# Patient Record
Sex: Male | Born: 1990 | Race: Black or African American | Hispanic: No | Marital: Single | Smoking: Current every day smoker
Health system: Southern US, Community
[De-identification: ages and names within clinical notes are randomized; demographics above are authoritative.]

## PROBLEM LIST (undated history)

## (undated) DIAGNOSIS — F1910 Other psychoactive substance abuse, uncomplicated: Secondary | ICD-10-CM

## (undated) DIAGNOSIS — J45909 Unspecified asthma, uncomplicated: Secondary | ICD-10-CM

---

## 2014-07-22 ENCOUNTER — Encounter (HOSPITAL_COMMUNITY): Payer: Self-pay | Admitting: Emergency Medicine

## 2014-07-22 ENCOUNTER — Emergency Department (HOSPITAL_COMMUNITY)
Admission: EM | Admit: 2014-07-22 | Discharge: 2014-07-22 | Disposition: A | Discharge status: Home / Self Care | Payer: BC Managed Care – PPO | Attending: Emergency Medicine | Admitting: Emergency Medicine

## 2014-07-22 DIAGNOSIS — IMO0002 Reserved for concepts with insufficient information to code with codable children: Secondary | ICD-10-CM | POA: Insufficient documentation

## 2014-07-22 DIAGNOSIS — J45901 Unspecified asthma with (acute) exacerbation: Secondary | ICD-10-CM | POA: Insufficient documentation

## 2014-07-22 DIAGNOSIS — F17200 Nicotine dependence, unspecified, uncomplicated: Secondary | ICD-10-CM | POA: Insufficient documentation

## 2014-07-22 HISTORY — DX: Unspecified asthma, uncomplicated: J45.909

## 2014-07-22 MED ORDER — PREDNISONE 20 MG PO TABS
60.0000 mg | ORAL_TABLET | Freq: Once | ORAL | Status: AC
  Administered 2014-07-22: 60 mg via ORAL
  Filled 2014-07-22: qty 3

## 2014-07-22 MED ORDER — ALBUTEROL SULFATE HFA 108 (90 BASE) MCG/ACT IN AERS
4.0000 | INHALATION_SPRAY | Freq: Once | RESPIRATORY_TRACT | Status: AC
  Administered 2014-07-22: 4 via RESPIRATORY_TRACT
  Filled 2014-07-22: qty 6.7

## 2014-07-22 MED ORDER — PREDNISONE 20 MG PO TABS: ORAL_TABLET | ORAL | Status: DC

## 2014-07-22 MED ORDER — IPRATROPIUM-ALBUTEROL 0.5-2.5 (3) MG/3ML IN SOLN
3.0000 mL | Freq: Once | RESPIRATORY_TRACT | Status: AC
  Administered 2014-07-22: 3 mL via RESPIRATORY_TRACT
  Filled 2014-07-22: qty 3

## 2014-07-22 NOTE — ED Provider Notes (Signed)
CSN: [id number]     Arrival date & time 07/22/14  0716 History   First MD Initiated Contact with Patient 07/22/14 0720     Chief Complaint  Patient presents with  . Asthma     (Consider location/radiation/quality/duration/timing/severity/associated sxs/prior Treatment) Patient is a 23 y.o. male presenting with asthma. The history is provided by the patient. No language interpreter was used.  Asthma This is a recurrent problem. The current episode started 2 days ago. The problem occurs constantly. The problem has been gradually worsening. Associated symptoms include shortness of breath. Pertinent negatives include no chest pain, no abdominal pain and no headaches. The symptoms are aggravated by exertion. The symptoms are relieved by rest and medications. Treatments tried: albuterol MDI. The treatment provided mild relief.    Past Medical History  Diagnosis Date  . Asthma    History reviewed. No pertinent past surgical history. No family history on file. History  Substance Use Topics  . Smoking status: Current Every Day Smoker -- 0.25 packs/day    Types: Cigarettes  . Smokeless tobacco: Not on file  . Alcohol Use: No    Review of Systems  Constitutional: Negative for fever, activity change, appetite change and fatigue.  HENT: Negative for congestion, facial swelling, rhinorrhea and trouble swallowing.   Eyes: Negative for photophobia and pain.  Respiratory: Positive for shortness of breath and wheezing. Negative for cough and chest tightness.   Cardiovascular: Negative for chest pain and leg swelling.  Gastrointestinal: Negative for nausea, vomiting, abdominal pain, diarrhea and constipation.  Endocrine: Negative for polydipsia and polyuria.  Genitourinary: Negative for dysuria, urgency, decreased urine volume and difficulty urinating.  Musculoskeletal: Negative for back pain and gait problem.  Skin: Negative for color change, rash and wound.  Allergic/Immunologic: Negative for  immunocompromised state.  Neurological: Negative for dizziness, facial asymmetry, speech difficulty, weakness, numbness and headaches.  Psychiatric/Behavioral: Negative for confusion, decreased concentration and agitation.      Allergies  Review of patient's allergies indicates no known allergies.  Home Medications   Prior to Admission medications   Medication Sig Start Date End Date Taking? Authorizing Provider  albuterol (PROVENTIL HFA;VENTOLIN HFA) 108 (90 BASE) MCG/ACT inhaler Inhale 2 puffs into the lungs every 6 (six) hours as needed for wheezing or shortness of breath.   Yes Historical Provider, MD  Fluticasone-Salmeterol (ADVAIR) 250-50 MCG/DOSE AEPB Inhale 1 puff into the lungs 2 (two) times daily.   Yes Historical Provider, MD  predniSONE (DELTASONE) 20 MG tablet 3 tabs po day one, then 2 po daily x 4 days 07/22/14   Shanna Cisco, MD   BP 135/81  Pulse 70  Temp(Src) 97.5 F (36.4 C) (Oral)  Resp 20  SpO2 99% Physical Exam  Constitutional: He is oriented to person, place, and time. He appears well-developed and well-nourished. No distress.  HENT:  Head: Normocephalic and atraumatic.  Mouth/Throat: No oropharyngeal exudate.  Eyes: Pupils are equal, round, and reactive to light.  Neck: Normal range of motion. Neck supple.  Cardiovascular: Normal rate, regular rhythm and normal heart sounds.  Exam reveals no gallop and no friction rub.   No murmur heard. Pulmonary/Chest: Effort normal. No respiratory distress. He has wheezes in the right upper field, the right middle field, the right lower field, the left upper field, the left middle field and the left lower field. He has no rales.  Abdominal: Soft. Bowel sounds are normal. He exhibits no distension and no mass. There is no tenderness. There is no rebound and  no guarding.  Musculoskeletal: Normal range of motion. He exhibits no edema and no tenderness.  Neurological: He is alert and oriented to person, place, and time.   Skin: Skin is warm and dry.  Psychiatric: He has a normal mood and affect.    ED Course  Procedures (including critical care time) Labs Review Labs Reviewed - No data to display  Imaging Review No results found.   EKG Interpretation None      MDM   Final diagnoses:  Acute asthma exacerbation, unspecified asthma severity    Pt is a 23 y.o. male with Pmhx as above who presents with about 2 days of inc SOB/wheezing c/w prior asthma exacerbations. Denies fever chills, CP. He has had some dry cough. He believes exacerbation triggered by running out of his advair earlier this week. On PE, VSS, pt in NAD, he has scattered wheezing throughout, but no accessory muscle use, no hypoxia. 60mg  PO prednisone given, as well as duoneb with improvement of symptoms.  Will d/c home w/ 5d prednisone burst and rec scheduled albuterol MDI every 4 hours.  Return precautions given for new or worsening symptoms including worsening SOB, fever, SOB not relieved by albuterol MDI.          Shanna CiscoMegan E Docherty, MD 07/22/14 [phone]

## 2014-07-22 NOTE — Discharge Instructions (Signed)

## 2014-07-22 NOTE — ED Notes (Signed)
Pt states that he had an asthma attack last night while he was working. States that it started all of a sudden and he has not has any cough/congestion prior to that. Pt states that he used 4 albuterol nebulizers last night and his inhaler as well. NAD noted pt speaking in full sentences

## 2014-12-11 ENCOUNTER — Encounter (HOSPITAL_COMMUNITY): Payer: Self-pay | Admitting: *Deleted

## 2014-12-11 ENCOUNTER — Emergency Department (HOSPITAL_COMMUNITY)
Admission: EM | Admit: 2014-12-11 | Discharge: 2014-12-11 | Disposition: A | Discharge status: Home / Self Care | Payer: BC Managed Care – PPO | Attending: Emergency Medicine | Admitting: Emergency Medicine

## 2014-12-11 ENCOUNTER — Emergency Department (HOSPITAL_COMMUNITY): Payer: BC Managed Care – PPO

## 2014-12-11 DIAGNOSIS — R000 Tachycardia, unspecified: Secondary | ICD-10-CM | POA: Insufficient documentation

## 2014-12-11 DIAGNOSIS — Z720 Tobacco use: Secondary | ICD-10-CM | POA: Diagnosis not present

## 2014-12-11 DIAGNOSIS — Z79899 Other long term (current) drug therapy: Secondary | ICD-10-CM | POA: Insufficient documentation

## 2014-12-11 DIAGNOSIS — R5383 Other fatigue: Secondary | ICD-10-CM | POA: Insufficient documentation

## 2014-12-11 DIAGNOSIS — J45909 Unspecified asthma, uncomplicated: Secondary | ICD-10-CM | POA: Diagnosis present

## 2014-12-11 DIAGNOSIS — Z7951 Long term (current) use of inhaled steroids: Secondary | ICD-10-CM | POA: Diagnosis not present

## 2014-12-11 DIAGNOSIS — R609 Edema, unspecified: Secondary | ICD-10-CM | POA: Insufficient documentation

## 2014-12-11 DIAGNOSIS — J45901 Unspecified asthma with (acute) exacerbation: Secondary | ICD-10-CM | POA: Insufficient documentation

## 2014-12-11 DIAGNOSIS — R0602 Shortness of breath: Secondary | ICD-10-CM

## 2014-12-11 LAB — CBC WITH DIFFERENTIAL/PLATELET
BASOS ABS: 0 10*3/uL (ref 0.0–0.1)
BASOS PCT: 0 % (ref 0–1)
Eosinophils Absolute: 0.6 10*3/uL (ref 0.0–0.7)
Eosinophils Relative: 9 % — ABNORMAL HIGH (ref 0–5)
HEMATOCRIT: 47.9 % (ref 39.0–52.0)
HEMOGLOBIN: 16 g/dL (ref 13.0–17.0)
LYMPHS PCT: 57 % — AB (ref 12–46)
Lymphs Abs: 4 10*3/uL (ref 0.7–4.0)
MCH: 27.8 pg (ref 26.0–34.0)
MCHC: 33.4 g/dL (ref 30.0–36.0)
MCV: 83.3 fL (ref 78.0–100.0)
MONOS PCT: 6 % (ref 3–12)
Monocytes Absolute: 0.4 10*3/uL (ref 0.1–1.0)
NEUTROS ABS: 2 10*3/uL (ref 1.7–7.7)
Neutrophils Relative %: 28 % — ABNORMAL LOW (ref 43–77)
Platelets: 248 10*3/uL (ref 150–400)
RBC: 5.75 MIL/uL (ref 4.22–5.81)
RDW: 13.4 % (ref 11.5–15.5)
WBC: 7.1 10*3/uL (ref 4.0–10.5)

## 2014-12-11 LAB — I-STAT CHEM 8, ED
BUN: 16 mg/dL (ref 6–23)
CHLORIDE: 107 meq/L (ref 96–112)
Calcium, Ion: 1.27 mmol/L — ABNORMAL HIGH (ref 1.12–1.23)
Creatinine, Ser: 1 mg/dL (ref 0.50–1.35)
Glucose, Bld: 104 mg/dL — ABNORMAL HIGH (ref 70–99)
HEMATOCRIT: 54 % — AB (ref 39.0–52.0)
Hemoglobin: 18.4 g/dL — ABNORMAL HIGH (ref 13.0–17.0)
Potassium: 3.9 mEq/L (ref 3.7–5.3)
Sodium: 144 mEq/L (ref 137–147)
TCO2: 22 mmol/L (ref 0–100)

## 2014-12-11 MED ORDER — SODIUM CHLORIDE 0.9 % IV BOLUS (SEPSIS)
1000.0000 mL | Freq: Once | INTRAVENOUS | Status: AC
  Administered 2014-12-11: 1000 mL via INTRAVENOUS

## 2014-12-11 MED ORDER — ALBUTEROL SULFATE (2.5 MG/3ML) 0.083% IN NEBU
5.0000 mg | INHALATION_SOLUTION | Freq: Once | RESPIRATORY_TRACT | Status: AC
  Administered 2014-12-11: 5 mg via RESPIRATORY_TRACT
  Filled 2014-12-11: qty 6

## 2014-12-11 MED ORDER — ALBUTEROL (5 MG/ML) CONTINUOUS INHALATION SOLN
10.0000 mg/h | INHALATION_SOLUTION | Freq: Once | RESPIRATORY_TRACT | Status: AC
  Administered 2014-12-11: 10 mg/h via RESPIRATORY_TRACT
  Filled 2014-12-11: qty 20

## 2014-12-11 MED ORDER — PREDNISONE 20 MG PO TABS: 60.0000 mg | ORAL_TABLET | Freq: Every day | ORAL | Status: DC

## 2014-12-11 MED ORDER — IPRATROPIUM BROMIDE 0.02 % IN SOLN
0.5000 mg | Freq: Once | RESPIRATORY_TRACT | Status: AC
  Administered 2014-12-11: 0.5 mg via RESPIRATORY_TRACT
  Filled 2014-12-11: qty 2.5

## 2014-12-11 MED ORDER — METHYLPREDNISOLONE SODIUM SUCC 125 MG IJ SOLR
125.0000 mg | Freq: Once | INTRAMUSCULAR | Status: AC
  Administered 2014-12-11: 125 mg via INTRAVENOUS
  Filled 2014-12-11: qty 2

## 2014-12-11 MED ORDER — ALBUTEROL SULFATE HFA 108 (90 BASE) MCG/ACT IN AERS
2.0000 | INHALATION_SPRAY | RESPIRATORY_TRACT | Status: DC | PRN
  Administered 2014-12-11: 2 via RESPIRATORY_TRACT
  Filled 2014-12-11: qty 6.7

## 2014-12-11 NOTE — ED Notes (Signed)
NAD at this time. Pt is stable and leaving with friend. 

## 2014-12-11 NOTE — Discharge Instructions (Signed)

## 2014-12-11 NOTE — ED Notes (Signed)
Pts SpO2 was at 90%on room air after the breathing treament. Pt placed on 2L [place], pts SpO2 increased to 95%

## 2014-12-11 NOTE — ED Provider Notes (Addendum)
CSN: [id number]     Arrival date & time 12/11/14  1231 History   First MD Initiated Contact with Patient 12/11/14 1240     Chief Complaint  Patient presents with  . Asthma     (Consider location/radiation/quality/duration/timing/severity/associated sxs/prior Treatment) Patient is a 23 y.o. male presenting with asthma. The history is provided by the patient.  Asthma This is a recurrent problem. Associated symptoms include chest pain and shortness of breath. Pertinent negatives include no abdominal pain.   patient has a history of asthma. Presents with increasing shortness of breath over the last week. Cough with some mild sputum production. Some mild relief with inhalers. He continues to smoke. Worsening short of breath today. Unable to complete sentences. No fevers. Initially hypoxic upon arrival Past Medical History  Diagnosis Date  . Asthma    History reviewed. No pertinent past surgical history. History reviewed. No pertinent family history. History  Substance Use Topics  . Smoking status: Current Every Day Smoker -- 0.25 packs/day    Types: Cigarettes  . Smokeless tobacco: Not on file  . Alcohol Use: No    Review of Systems  Constitutional: Positive for fatigue. Negative for activity change and appetite change.  Eyes: Negative for pain.  Respiratory: Positive for cough, shortness of breath and wheezing. Negative for chest tightness.   Cardiovascular: Positive for chest pain. Negative for leg swelling.  Gastrointestinal: Negative for nausea, vomiting, abdominal pain and diarrhea.  Skin: Negative for rash.  Neurological: Negative for weakness and numbness.      Allergies  Review of patient's allergies indicates no known allergies.  Home Medications   Prior to Admission medications   Medication Sig Start Date End Date Taking? Authorizing Provider  albuterol (PROVENTIL HFA;VENTOLIN HFA) 108 (90 BASE) MCG/ACT inhaler Inhale 2 puffs into the lungs every 6 (six) hours as  needed for wheezing or shortness of breath.   Yes Historical Provider, MD  Fluticasone-Salmeterol (ADVAIR) 250-50 MCG/DOSE AEPB Inhale 1 puff into the lungs 2 (two) times daily.   Yes Historical Provider, MD  predniSONE (DELTASONE) 20 MG tablet Take 3 tablets (60 mg total) by mouth daily. 12/11/14   Juliet RudeNathan R. Raiden Haydu, MD   BP 130/75 mmHg  Pulse 86  Temp(Src) 98.2 F (36.8 C)  Resp 20  SpO2 93% Physical Exam  Constitutional: He appears well-developed and well-nourished.  Eyes: Pupils are equal, round, and reactive to light.  Neck: Neck supple.  Cardiovascular:  Tachycardia  Pulmonary/Chest: He is in respiratory distress. He has wheezes.  Diffuse harsh wheezes with respiratory distress. Unable to complete sentences.  Abdominal: Soft.  Musculoskeletal: Normal range of motion. He exhibits edema.  Skin: Skin is warm.    ED Course  Procedures (including critical care time) Labs Review Labs Reviewed  CBC WITH DIFFERENTIAL - Abnormal; Notable for the following:    Neutrophils Relative % 28 (*)    Lymphocytes Relative 57 (*)    Eosinophils Relative 9 (*)    All other components within normal limits  I-STAT CHEM 8, ED - Abnormal; Notable for the following:    Glucose, Bld 104 (*)    Calcium, Ion 1.27 (*)    Hemoglobin 18.4 (*)    HCT 54.0 (*)    All other components within normal limits    Imaging Review No results found.   EKG Interpretation   Date/Time:  Monday December 11 2014 12:53:31 EST Ventricular Rate:  107 PR Interval:  206 QRS Duration: 69 QT Interval:  313 QTC Calculation: 417  R Axis:   79 Text Interpretation:  Prolonged PR interval Biatrial enlargement  Anteroseptal infarct, age indeterminate Re[facility]  MD,  Zayah Keilman [phone]([id number]) on 01/03/2015 6:58:55 AM      MDM   Final diagnoses:  SOB (shortness of breath)  Asthma exacerbation    Patient with shortness of breath. History of asthma. Hypoxic off oxygen. Clinically great improvement after  first breathing treatment, however does have mild hypoxia with sats at 90% at rest without oxygen. Will try second breathing treatment and if still improved may discharge home    Juliet Rudeathan R. Rubin PayorPickering, MD 12/11/14 1606    Juliet RudeNathan R. Rubin PayorPickering, MD 01/03/15 [phone]

## 2014-12-11 NOTE — ED Notes (Signed)
Pt reports having asthma, feeling bad for weeks, no relief with inhalers. Unable to speak in full sentences at triage, spo2 87%. HR 131.

## 2014-12-11 NOTE — ED Notes (Signed)
Respiratory Contacted to start the continuous nebulizer breathing treatment.

## 2015-03-24 ENCOUNTER — Encounter (HOSPITAL_COMMUNITY): Payer: Self-pay | Admitting: *Deleted

## 2015-03-24 ENCOUNTER — Emergency Department (HOSPITAL_COMMUNITY)
Admission: EM | Admit: 2015-03-24 | Discharge: 2015-03-24 | Discharge status: Against Medical Advice | Payer: Self-pay | Attending: Emergency Medicine | Admitting: Emergency Medicine

## 2015-03-24 DIAGNOSIS — Z720 Tobacco use: Secondary | ICD-10-CM | POA: Insufficient documentation

## 2015-03-24 DIAGNOSIS — J45901 Unspecified asthma with (acute) exacerbation: Secondary | ICD-10-CM | POA: Insufficient documentation

## 2015-03-24 NOTE — ED Notes (Signed)
Pt states he will come back tomorrow. Did not want to stay.

## 2015-03-24 NOTE — ED Notes (Signed)
Pt c/o SOB. States he ran out of his inhaler today and needs a new prescription. States he tried  His nebulizer today with no relief. Dry cough.

## 2015-03-29 ENCOUNTER — Encounter (HOSPITAL_COMMUNITY): Payer: Self-pay | Admitting: *Deleted

## 2015-03-29 ENCOUNTER — Emergency Department (HOSPITAL_COMMUNITY): Payer: BLUE CROSS/BLUE SHIELD

## 2015-03-29 ENCOUNTER — Inpatient Hospital Stay (HOSPITAL_COMMUNITY)
Admission: EM | Admit: 2015-03-29 | Discharge: 2015-04-01 | DRG: 202 | Disposition: A | Discharge status: Home / Self Care | Payer: BLUE CROSS/BLUE SHIELD | Attending: Internal Medicine | Admitting: Internal Medicine

## 2015-03-29 DIAGNOSIS — J45901 Unspecified asthma with (acute) exacerbation: Principal | ICD-10-CM

## 2015-03-29 DIAGNOSIS — F1210 Cannabis abuse, uncomplicated: Secondary | ICD-10-CM | POA: Diagnosis present

## 2015-03-29 DIAGNOSIS — F17210 Nicotine dependence, cigarettes, uncomplicated: Secondary | ICD-10-CM | POA: Diagnosis present

## 2015-03-29 DIAGNOSIS — Z7951 Long term (current) use of inhaled steroids: Secondary | ICD-10-CM

## 2015-03-29 DIAGNOSIS — J9601 Acute respiratory failure with hypoxia: Secondary | ICD-10-CM | POA: Diagnosis present

## 2015-03-29 DIAGNOSIS — Z7952 Long term (current) use of systemic steroids: Secondary | ICD-10-CM | POA: Diagnosis not present

## 2015-03-29 DIAGNOSIS — J4542 Moderate persistent asthma with status asthmaticus: Secondary | ICD-10-CM

## 2015-03-29 DIAGNOSIS — Z825 Family history of asthma and other chronic lower respiratory diseases: Secondary | ICD-10-CM

## 2015-03-29 DIAGNOSIS — E872 Acidosis: Secondary | ICD-10-CM | POA: Diagnosis present

## 2015-03-29 DIAGNOSIS — F1910 Other psychoactive substance abuse, uncomplicated: Secondary | ICD-10-CM

## 2015-03-29 DIAGNOSIS — R0600 Dyspnea, unspecified: Secondary | ICD-10-CM

## 2015-03-29 DIAGNOSIS — R0603 Acute respiratory distress: Secondary | ICD-10-CM | POA: Diagnosis present

## 2015-03-29 DIAGNOSIS — J45902 Unspecified asthma with status asthmaticus: Secondary | ICD-10-CM | POA: Insufficient documentation

## 2015-03-29 HISTORY — DX: Other psychoactive substance abuse, uncomplicated: F19.10

## 2015-03-29 LAB — I-STAT VENOUS BLOOD GAS, ED
Acid-base deficit: 3 mmol/L — ABNORMAL HIGH (ref 0.0–2.0)
BICARBONATE: 25.3 meq/L — AB (ref 20.0–24.0)
O2 Saturation: 88 %
PCO2 VEN: 59.6 mmHg — AB (ref 45.0–50.0)
PH VEN: 7.235 — AB (ref 7.250–7.300)
PO2 VEN: 67 mmHg — AB (ref 30.0–45.0)
TCO2: 27 mmol/L (ref 0–100)

## 2015-03-29 LAB — I-STAT ARTERIAL BLOOD GAS, ED
Acid-base deficit: 3 mmol/L — ABNORMAL HIGH (ref 0.0–2.0)
BICARBONATE: 23.7 meq/L (ref 20.0–24.0)
O2 Saturation: 97 %
PH ART: 7.295 — AB (ref 7.350–7.450)
Patient temperature: 97.7
TCO2: 25 mmol/L (ref 0–100)
pCO2 arterial: 48.4 mmHg — ABNORMAL HIGH (ref 35.0–45.0)
pO2, Arterial: 102 mmHg — ABNORMAL HIGH (ref 80.0–100.0)

## 2015-03-29 LAB — POCT I-STAT 3, ART BLOOD GAS (G3+)
Acid-base deficit: 2 mmol/L (ref 0.0–2.0)
Bicarbonate: 23.5 mEq/L (ref 20.0–24.0)
O2 Saturation: 97 %
TCO2: 25 mmol/L (ref 0–100)
pCO2 arterial: 41.9 mmHg (ref 35.0–45.0)
pH, Arterial: 7.357 (ref 7.350–7.450)
pO2, Arterial: 94 mmHg (ref 80.0–100.0)

## 2015-03-29 LAB — BASIC METABOLIC PANEL
Anion gap: 7 (ref 5–15)
BUN: 13 mg/dL (ref 6–23)
CO2: 27 mmol/L (ref 19–32)
Calcium: 9.1 mg/dL (ref 8.4–10.5)
Chloride: 106 mmol/L (ref 96–112)
Creatinine, Ser: 1.17 mg/dL (ref 0.50–1.35)
GFR calc Af Amer: >90 mL/min (ref >90)
GFR, EST NON AFRICAN AMERICAN: 86 mL/min — AB (ref >90)
GLUCOSE: 94 mg/dL (ref 70–99)
POTASSIUM: 3.7 mmol/L (ref 3.5–5.1)
Sodium: 140 mmol/L (ref 135–145)

## 2015-03-29 LAB — RAPID URINE DRUG SCREEN, HOSP PERFORMED
AMPHETAMINES: NOT DETECTED
BARBITURATES: NOT DETECTED
BENZODIAZEPINES: NOT DETECTED
COCAINE: NOT DETECTED
Opiates: NOT DETECTED
TETRAHYDROCANNABINOL: POSITIVE — AB

## 2015-03-29 LAB — CBC WITH DIFFERENTIAL/PLATELET
BASOS ABS: 0 10*3/uL (ref 0.0–0.1)
Basophils Relative: 0 % (ref 0–1)
Eosinophils Absolute: 0.5 10*3/uL (ref 0.0–0.7)
Eosinophils Relative: 7 % — ABNORMAL HIGH (ref 0–5)
HCT: 43.4 % (ref 39.0–52.0)
Hemoglobin: 14.7 g/dL (ref 13.0–17.0)
LYMPHS PCT: 54 % — AB (ref 12–46)
Lymphs Abs: 3.6 10*3/uL (ref 0.7–4.0)
MCH: 28.2 pg (ref 26.0–34.0)
MCHC: 33.9 g/dL (ref 30.0–36.0)
MCV: 83.3 fL (ref 78.0–100.0)
Monocytes Absolute: 0.4 10*3/uL (ref 0.1–1.0)
Monocytes Relative: 6 % (ref 3–12)
NEUTROS ABS: 2.2 10*3/uL (ref 1.7–7.7)
Neutrophils Relative %: 33 % — ABNORMAL LOW (ref 43–77)
PLATELETS: 200 10*3/uL (ref 150–400)
RBC: 5.21 MIL/uL (ref 4.22–5.81)
RDW: 12.8 % (ref 11.5–15.5)
WBC: 6.7 10*3/uL (ref 4.0–10.5)

## 2015-03-29 LAB — INFLUENZA PANEL BY PCR (TYPE A & B)
H1N1 flu by pcr: NOT DETECTED
INFLBPCR: NEGATIVE
Influenza A By PCR: NEGATIVE

## 2015-03-29 LAB — HIV ANTIBODY (ROUTINE TESTING W REFLEX): HIV SCREEN 4TH GENERATION: NONREACTIVE

## 2015-03-29 LAB — MRSA PCR SCREENING: MRSA BY PCR: NEGATIVE

## 2015-03-29 MED ORDER — DM-GUAIFENESIN ER 30-600 MG PO TB12
1.0000 | ORAL_TABLET | Freq: Two times a day (BID) | ORAL | Status: DC
  Administered 2015-03-29 – 2015-04-01 (×7): 1 via ORAL
  Filled 2015-03-29 (×8): qty 1

## 2015-03-29 MED ORDER — NICOTINE 14 MG/24HR TD PT24
14.0000 mg | MEDICATED_PATCH | Freq: Every day | TRANSDERMAL | Status: DC
  Filled 2015-03-29 (×4): qty 1

## 2015-03-29 MED ORDER — IPRATROPIUM BROMIDE 0.02 % IN SOLN
1.0000 mg | Freq: Once | RESPIRATORY_TRACT | Status: AC
  Administered 2015-03-29: 1 mg via RESPIRATORY_TRACT
  Filled 2015-03-29: qty 5

## 2015-03-29 MED ORDER — CETYLPYRIDINIUM CHLORIDE 0.05 % MT LIQD
7.0000 mL | Freq: Two times a day (BID) | OROMUCOSAL | Status: DC
  Administered 2015-03-29 – 2015-03-31 (×3): 7 mL via OROMUCOSAL

## 2015-03-29 MED ORDER — AZITHROMYCIN 250 MG PO TABS
500.0000 mg | ORAL_TABLET | Freq: Every day | ORAL | Status: AC
  Administered 2015-03-29: 500 mg via ORAL
  Filled 2015-03-29: qty 2

## 2015-03-29 MED ORDER — MAGNESIUM SULFATE 2 GM/50ML IV SOLN
2.0000 g | Freq: Once | INTRAVENOUS | Status: AC
  Administered 2015-03-29: 2 g via INTRAVENOUS
  Filled 2015-03-29: qty 50

## 2015-03-29 MED ORDER — IPRATROPIUM BROMIDE 0.02 % IN SOLN
0.5000 mg | Freq: Once | RESPIRATORY_TRACT | Status: AC
  Administered 2015-03-29: 0.5 mg via RESPIRATORY_TRACT
  Filled 2015-03-29: qty 2.5

## 2015-03-29 MED ORDER — IPRATROPIUM-ALBUTEROL 0.5-2.5 (3) MG/3ML IN SOLN
3.0000 mL | RESPIRATORY_TRACT | Status: DC
  Administered 2015-03-29 – 2015-03-31 (×15): 3 mL via RESPIRATORY_TRACT
  Filled 2015-03-29 (×14): qty 3

## 2015-03-29 MED ORDER — METHYLPREDNISOLONE SODIUM SUCC 125 MG IJ SOLR
60.0000 mg | Freq: Four times a day (QID) | INTRAMUSCULAR | Status: DC
  Administered 2015-03-29 – 2015-03-30 (×5): 60 mg via INTRAVENOUS
  Filled 2015-03-29 (×2): qty 0.96
  Filled 2015-03-29 (×3): qty 2
  Filled 2015-03-29: qty 0.96
  Filled 2015-03-29 (×2): qty 2
  Filled 2015-03-29: qty 0.96

## 2015-03-29 MED ORDER — SODIUM CHLORIDE 0.9 % IV SOLN
INTRAVENOUS | Status: DC
  Administered 2015-03-29: 05:00:00 via INTRAVENOUS

## 2015-03-29 MED ORDER — AZITHROMYCIN 250 MG PO TABS
250.0000 mg | ORAL_TABLET | Freq: Every day | ORAL | Status: DC
  Administered 2015-03-30 – 2015-04-01 (×3): 250 mg via ORAL
  Filled 2015-03-29 (×3): qty 1

## 2015-03-29 MED ORDER — HEPARIN SODIUM (PORCINE) 5000 UNIT/ML IJ SOLN
5000.0000 [IU] | Freq: Three times a day (TID) | INTRAMUSCULAR | Status: DC
  Administered 2015-03-29 – 2015-04-01 (×9): 5000 [IU] via SUBCUTANEOUS
  Filled 2015-03-29 (×12): qty 1

## 2015-03-29 MED ORDER — CHLORHEXIDINE GLUCONATE 0.12 % MT SOLN
15.0000 mL | Freq: Two times a day (BID) | OROMUCOSAL | Status: DC
  Administered 2015-03-30 – 2015-04-01 (×3): 15 mL via OROMUCOSAL
  Filled 2015-03-29 (×5): qty 15

## 2015-03-29 MED ORDER — ALBUTEROL SULFATE (2.5 MG/3ML) 0.083% IN NEBU
5.0000 mg | INHALATION_SOLUTION | Freq: Once | RESPIRATORY_TRACT | Status: AC
  Administered 2015-03-29: 5 mg via RESPIRATORY_TRACT
  Filled 2015-03-29: qty 6

## 2015-03-29 MED ORDER — EPINEPHRINE 0.3 MG/0.3ML IJ SOAJ
0.3000 mg | Freq: Once | INTRAMUSCULAR | Status: AC
  Administered 2015-03-29: 0.3 mg via INTRAMUSCULAR
  Filled 2015-03-29: qty 0.3

## 2015-03-29 MED ORDER — ALBUTEROL (5 MG/ML) CONTINUOUS INHALATION SOLN
10.0000 mg/h | INHALATION_SOLUTION | RESPIRATORY_TRACT | Status: DC
  Administered 2015-03-29: 10 mg/h via RESPIRATORY_TRACT
  Filled 2015-03-29: qty 20

## 2015-03-29 MED ORDER — PREDNISONE 20 MG PO TABS
60.0000 mg | ORAL_TABLET | Freq: Once | ORAL | Status: AC
  Administered 2015-03-29: 60 mg via ORAL
  Filled 2015-03-29: qty 3

## 2015-03-29 MED ORDER — SODIUM CHLORIDE 0.9 % IV BOLUS (SEPSIS)
1000.0000 mL | Freq: Once | INTRAVENOUS | Status: AC
  Administered 2015-03-29: 1000 mL via INTRAVENOUS

## 2015-03-29 NOTE — ED Notes (Signed)
Pt's mother called asking of pt status. Pt unable to talk due to bipap machine but informed it was okay to update his mother.

## 2015-03-29 NOTE — ED Notes (Signed)
Oni, MD at bedside.  

## 2015-03-29 NOTE — ED Notes (Signed)
The pt is c/o diff breathing for 1-2 weeks.  He ran out of his inhaler today.  He was here 2-03 days ago for the same but left  After he grew tired of waiting.

## 2015-03-29 NOTE — Progress Notes (Signed)
24 year old with h/o asthma, presented with sob and productive cough ealrier this am. Please see detailed H&H earlier today by Dr. Clyde LundborgNiu. He was admitted for asthma exacerbation, started on iv solumedrol and was put on BIPAP. He was admitted to step down.  A repeat ABG showed improvement in the PO2. Plan to d/c BIPAP and put him on [place] oxygen and watch him overnight.  Continue to monitor.   Kathlen ModyVijaya Lavene Penagos, MD [phone]

## 2015-03-29 NOTE — ED Provider Notes (Signed)
CSN: [id number]     Arrival date & time 03/29/15  0117 History  This chart was scribed for Tomasita Crumble, MD by Evon Slack, ED Scribe. This patient was seen in room D33C/D33C and the patient's care was started at 1:25 AM.     Chief Complaint  Patient presents with  . Asthma   Patient is a 24 y.o. male presenting with shortness of breath. The history is provided by the patient. No language interpreter was used.  Shortness of Breath Severity:  Moderate Onset quality:  Gradual Duration:  10 days Progression:  Worsening Chronicity:  Recurrent Context: known allergens   Relieved by:  Nothing Ineffective treatments:  Inhaler Associated symptoms: wheezing   Associated symptoms: no chest pain    HPI Comments: Jordan Dickerson is a 24 y.o. male who presents to the Emergency Department complaining of difficulty breathing for the past 1.5 week. Pt states that he has associated wheezing. Pt states that he has used his inhaler with no relief. Pt denies edema or chest pain. Pt states that he has been admitted for his asthma in the past. Pt states that he has a Hx of allergies that also make his symptoms worse. Pt denies any recent travel.    Past Medical History  Diagnosis Date  . Asthma    History reviewed. No pertinent past surgical history. No family history on file. History  Substance Use Topics  . Smoking status: Current Every Day Smoker -- 0.25 packs/day    Types: Cigarettes  . Smokeless tobacco: Not on file  . Alcohol Use: No    Review of Systems  Respiratory: Positive for shortness of breath and wheezing.   Cardiovascular: Negative for chest pain.  All other systems reviewed and are negative.     Allergies  Review of patient's allergies indicates no known allergies.  Home Medications   Prior to Admission medications   Medication Sig Start Date End Date Taking? Authorizing Provider  albuterol (PROVENTIL HFA;VENTOLIN HFA) 108 (90 BASE) MCG/ACT inhaler Inhale 2 puffs into the  lungs every 6 (six) hours as needed for wheezing or shortness of breath.    Historical Provider, MD  Fluticasone-Salmeterol (ADVAIR) 250-50 MCG/DOSE AEPB Inhale 1 puff into the lungs 2 (two) times daily.    Historical Provider, MD  predniSONE (DELTASONE) 20 MG tablet Take 3 tablets (60 mg total) by mouth daily. 12/11/14   Benjiman Core, MD   BP 134/86 mmHg  Pulse 92  Temp(Src) 97.7 F (36.5 C) (Oral)  Resp 24  SpO2 90%   Physical Exam  Constitutional: He is oriented to person, place, and time. Vital signs are normal. He appears well-developed and well-nourished.  Non-toxic appearance. He does not appear ill. No distress.  HENT:  Head: Normocephalic and atraumatic.  Nose: Nose normal.  Mouth/Throat: Oropharynx is clear and moist. No oropharyngeal exudate.  Eyes: Conjunctivae and EOM are normal. Pupils are equal, round, and reactive to light. No scleral icterus.  Neck: Normal range of motion. Neck supple. No tracheal deviation, no edema, no erythema and normal range of motion present. No thyroid mass and no thyromegaly present.  Cardiovascular: Normal rate, regular rhythm, S1 normal, S2 normal, normal heart sounds, intact distal pulses and normal pulses.  Exam reveals no gallop and no friction rub.   No murmur heard. Pulses:      Radial pulses are 2+ on the right side, and 2+ on the left side.       Dorsalis pedis pulses are 2+ on the right  side, and 2+ on the left side.  Pulmonary/Chest: Accessory muscle usage present. Tachypnea noted. No respiratory distress. He has wheezes. He has no rhonchi. He has no rales.  Increased work of breathing. Pt not speaking in full sentences.   Abdominal: Soft. Normal appearance and bowel sounds are normal. He exhibits no distension, no ascites and no mass. There is no hepatosplenomegaly. There is no tenderness. There is no rebound, no guarding and no CVA tenderness.  Musculoskeletal: Normal range of motion. He exhibits no edema or tenderness.   Lymphadenopathy:    He has no cervical adenopathy.  Neurological: He is alert and oriented to person, place, and time. He has normal strength. No cranial nerve deficit or sensory deficit.  Skin: Skin is warm, dry and intact. No petechiae and no rash noted. He is not diaphoretic. No erythema. No pallor.  Psychiatric: He has a normal mood and affect. His behavior is normal. Judgment normal.  Nursing note and vitals reviewed.   ED Course  Procedures (including critical care time) DIAGNOSTIC STUDIES: Oxygen Saturation is 98% on RA, normal by my interpretation.    COORDINATION OF CARE: 1:31 AM-Discussed treatment plan with pt at bedside and pt agreed to plan.     Labs Review Labs Reviewed  CBC WITH DIFFERENTIAL/PLATELET - Abnormal; Notable for the following:    Neutrophils Relative % 33 (*)    Lymphocytes Relative 54 (*)    Eosinophils Relative 7 (*)    All other components within normal limits  BASIC METABOLIC PANEL - Abnormal; Notable for the following:    GFR calc non Af Amer 86 (*)    All other components within normal limits  I-STAT VENOUS BLOOD GAS, ED - Abnormal; Notable for the following:    pH, Ven 7.235 (*)    pCO2, Ven 59.6 (*)    pO2, Ven 67.0 (*)    Bicarbonate 25.3 (*)    Acid-base deficit 3.0 (*)    All other components within normal limits  I-STAT ARTERIAL BLOOD GAS, ED - Abnormal; Notable for the following:    pH, Arterial 7.295 (*)    pCO2 arterial 48.4 (*)    pO2, Arterial 102.0 (*)    Acid-base deficit 3.0 (*)    All other components within normal limits  RESPIRATORY VIRUS PANEL  CULTURE, BLOOD (ROUTINE X 2)  CULTURE, BLOOD (ROUTINE X 2)  CULTURE, EXPECTORATED SPUTUM-ASSESSMENT  GRAM STAIN  BLOOD GAS, ARTERIAL  INFLUENZA PANEL BY PCR (TYPE A & B, H1N1)  URINE RAPID DRUG SCREEN (HOSP PERFORMED)  HIV ANTIBODY (ROUTINE TESTING)  LEGIONELLA ANTIGEN, URINE  STREP PNEUMONIAE URINARY ANTIGEN    Imaging Review Dg Chest Port 1 View  03/29/2015   CLINICAL  DATA:  Asthma exacerbation.  Shortness of breath.  EXAM: PORTABLE CHEST - 1 VIEW  COMPARISON:  12/11/2014  FINDINGS: Hyperinflation. The heart size and mediastinal contours are within normal limits. Both lungs are clear. The visualized skeletal structures are unremarkable.  IMPRESSION: No active disease.   Electronically Signed   By: Burman NievesWilliam  Stevens M.D.   On: 03/29/2015 02:31     EKG Interpretation   Date/Time:  Thursday March 29 2015 05:04:07 EDT Ventricular Rate:  106 PR Interval:  169 QRS Duration: 80 QT Interval:  332 QTC Calculation: 441 R Axis:   89 Text Interpretation:  Sinus tachycardia Artifact Confirmed by Erroll Lunani, Fowler Antos  Ayokunle [phone] on 03/29/2015 5:40:04 AM      MDM   Final diagnoses:  None    patient presents  emergency department for asthma exacerbation. He was initially placed on continuous albuterol treatment, given steroids, IVF and magnesium. I was called to the bedside approx later because the patient still had not received any albuterol.  His respiratory status had worsened, he became more tachypneic, diaphoretic, and using more accessory muscles.  Breathing treatment was finally started and patient was placed on bipap.  He was admitted to triad hospitalist with Dr. Clyde Lundborg in the stepdown unit for continued management.    CRITICAL CARE Performed by: Tomasita Crumble   Total critical care time: .  Critical care time was exclusive of separately billable procedures and treating other patients.  Critical care was necessary to treat or prevent imminent or life-threatening deterioration.  Critical care was time spent personally by me on the following activities: development of treatment plan with patient and/or surrogate as well as nursing, discussions with consultants, evaluation of patient's response to treatment, examination of patient, obtaining history from patient or surrogate, ordering and performing treatments and interventions, ordering and review of  laboratory studies, ordering and review of radiographic studies, pulse oximetry and re-evaluation of patient's condition.   I personally performed the services described in this documentation, which was scribed in my presence. The recorded information has been reviewed and is accurate.     Tomasita Crumble, MD 03/29/15 [phone]

## 2015-03-29 NOTE — H&P (Signed)
[facility] History and Physical  Jaizon Deroos ZOX:[id number] DOB: March 07, 1991 DOA: 03/29/2015  Referring physician: ED physician PCP: No primary care provider on file.  Specialists:   Chief Complaint: Productive cough and shortness of breath  HPI: Jordan Dickerson is a 24 y.o. male with past medical history of asthma, substance abuse, who presents with productive cough and shortness of breath.  Patient reports that he has been coughing and having shortness of breath in the past 10 days. His symptoms have been progressively getting worse. It is associated with wheezing. He coughs up some greenish colored sputum. Pt states t hat he has used his inhaler with no relief. Pt denies any recent travelling. Patient admitted that he has been using marijuana often.    Patient denies fever, chills, chest pain, abdominal pain, diarrhea, constipation, dysuria, urgency, frequency, hematuria, skin rashes, joint pain or leg swelling. No unilateral weakness, numbness or tingling sensations. No vision change or hearing loss.   In ED, patient was found to have negative chest x-ray, CBC 6.7, temperature normal, tachycardia, electrolytes okay. Patient is admitted to inpatient for further evaluation and treatment.  Review of Systems: As presented in the history of presenting illness, rest negative.  Where does patient live?  At home Can patient participate in ADLs? Yes  Allergy: No Known Allergies  Past Medical History  Diagnosis Date  . Asthma   . Substance abuse     History reviewed. No pertinent past surgical history.  Social History:  reports that he has been smoking Cigarettes.  He has been smoking about 0.25 packs per day. He does not have any smokeless tobacco history on file. He reports that he uses illicit drugs (Marijuana) about 7 times per week. He reports that he does not drink alcohol.  Family History:  Family History  Problem Relation Age of Onset  . Asthma Father      Prior to  Admission medications   Medication Sig Start Date End Date Taking? Authorizing Provider  predniSONE (DELTASONE) 20 MG tablet Take 3 tablets (60 mg total) by mouth daily. Patient not taking: Reported on 03/29/2015 12/11/14   Benjiman Core, MD    Physical Exam: Filed Vitals:   03/29/15 0245 03/29/15 0300 03/29/15 0315 03/29/15 0330  BP: 148/78 149/86 151/90 136/122  Pulse: 100 89 94 102  Temp:      TempSrc:      Resp: SpO2: 99% 100% 100% 100%   General: In acute distress HEENT:       Eyes: PERRL, EOMI, no scleral icterus       ENT: No discharge from the ears and nose, no pharynx injection, no tonsillar enlargement.        Neck: No JVD, no bruit, no mass felt. Cardiac: S1/S2, RRR, No murmurs, No gallops or rubs Pulm: Severely decreased air movement bilaterally. Has some wheezing bilaterally. Abd: Soft, nondistended, nontender, no rebound pain, no organomegaly, BS present Ext: No edema bilaterally. 2+DP/PT pulse bilaterally Musculoskeletal: No joint deformities, erythema, or stiffness, ROM full Skin: No rashes.  Neuro: Alert and oriented X3, cranial nerves II-XII grossly intact, muscle strength 5/5 in all extremeties, sensation to light touch intact.  Psych: Patient is not psychotic, no suicidal or hemocidal ideation.  Labs on Admission:  Basic Metabolic Panel:  Recent Labs Lab 03/29/15 0120  NA 140  K 3.7  CL 106  CO2 27  GLUCOSE 94  BUN 13  CREATININE 1.17  CALCIUM 9.1   Liver Function Tests: No  results for input(s): AST, ALT, ALKPHOS, BILITOT, PROT, ALBUMIN in the last 168 hours. No results for input(s): LIPASE, AMYLASE in the last 168 hours. No results for input(s): AMMONIA in the last 168 hours. CBC:  Recent Labs Lab 03/29/15 0120  WBC 6.7  NEUTROABS 2.2  HGB 14.7  HCT 43.4  MCV 83.3  PLT 200   Cardiac Enzymes: No results for input(s): CKTOTAL, CKMB, CKMBINDEX, TROPONINI in the last 168 hours.  BNP (last 3 results) No results for  input(s): BNP in the last 8760 hours.  ProBNP (last 3 results) No results for input(s): PROBNP in the last 8760 hours.  CBG: No results for input(s): GLUCAP in the last 168 hours.  Radiological Exams on Admission: Dg Chest Port 1 View  03/29/2015   CLINICAL DATA:  Asthma exacerbation.  Shortness of breath.  EXAM: PORTABLE CHEST - 1 VIEW  COMPARISON:  12/11/2014  FINDINGS: Hyperinflation. The heart size and mediastinal contours are within normal limits. Both lungs are clear. The visualized skeletal structures are unremarkable.  IMPRESSION: No active disease.   Electronically Signed   By: Burman NievesWilliam  Stevens M.D.   On: 03/29/2015 02:31    EKG: will get one  Assessment/Plan Principal Problem:   Asthma exacerbation Active Problems:   Substance abuse   Respiratory distress  Asthma exacerbation: Patient's symptoms are most likely caused by asthma exacerbation. Chest x-ray is negative for pneumonia. Patient received 1 dose of magnesium sulfate and 60 mg prednisone a emergency room. He still has severe respiratory distress.  -will admit patient to SDU given the need of BiPAP -Nebulizers: scheduled Duoneb and continueous albuterol -Solu-Medrol 60 mg IV q6h -Oral azithromycin for 5 days.  -Mucinex for cough  -Urine legionella and S. pneumococcal antigen -Follow up blood culture x2, sputum culture, respiratory virus panel, Flu pcr -ABG  Polysubstance abuse: Including smoking and marijuana use. -Did counseling about importance of completing substance. -Nicotine patch -Urine drug screen, HIV  DVT ppx: SQ Heparin     Code Status: Full code Family Communication: None at bed side.     Disposition Plan: Admit to inpatient   Date of Service 03/29/2015    Lorretta HarpIU, [place] Streat [facility] Pager [phone]  If 7PM-7AM, please contact night-coverage [email or web address] Password [email or web address] 03/29/2015, 4:35 AM

## 2015-03-30 LAB — RESPIRATORY VIRUS PANEL
ADENOVIRUS: NEGATIVE
Influenza A: NEGATIVE
Influenza B: NEGATIVE
Metapneumovirus: NEGATIVE
Parainfluenza 1: NEGATIVE
Parainfluenza 2: NEGATIVE
Parainfluenza 3: NEGATIVE
RESPIRATORY SYNCYTIAL VIRUS A: NEGATIVE
RESPIRATORY SYNCYTIAL VIRUS B: NEGATIVE
RHINOVIRUS: NEGATIVE

## 2015-03-30 LAB — LEGIONELLA ANTIGEN, URINE

## 2015-03-30 LAB — STREP PNEUMONIAE URINARY ANTIGEN: Strep Pneumo Urinary Antigen: NEGATIVE

## 2015-03-30 LAB — BASIC METABOLIC PANEL
ANION GAP: 7 (ref 5–15)
BUN: 20 mg/dL (ref 6–23)
CO2: 27 mmol/L (ref 19–32)
CREATININE: 1.06 mg/dL (ref 0.50–1.35)
Calcium: 9.8 mg/dL (ref 8.4–10.5)
Chloride: 104 mmol/L (ref 96–112)
GFR calc Af Amer: >90 mL/min (ref >90)
GFR calc non Af Amer: >90 mL/min (ref >90)
GLUCOSE: 128 mg/dL — AB (ref 70–99)
Potassium: 4.3 mmol/L (ref 3.5–5.1)
Sodium: 138 mmol/L (ref 135–145)

## 2015-03-30 LAB — D-DIMER, QUANTITATIVE: D-Dimer, Quant: <0.27 ug/mL-FEU (ref 0.00–0.48)

## 2015-03-30 MED ORDER — ALPRAZOLAM 0.5 MG PO TABS
0.5000 mg | ORAL_TABLET | Freq: Three times a day (TID) | ORAL | Status: DC | PRN
  Administered 2015-04-01: 0.5 mg via ORAL
  Filled 2015-03-30: qty 1

## 2015-03-30 MED ORDER — METHYLPREDNISOLONE SODIUM SUCC 125 MG IJ SOLR
60.0000 mg | Freq: Two times a day (BID) | INTRAMUSCULAR | Status: DC
Start: 2015-03-30 — End: 2015-04-01
  Administered 2015-03-30 – 2015-04-01 (×4): 60 mg via INTRAVENOUS
  Filled 2015-03-30 (×4): qty 0.96

## 2015-03-30 MED ORDER — LORAZEPAM 2 MG/ML IJ SOLN
1.0000 mg | INTRAMUSCULAR | Status: DC | PRN
  Administered 2015-03-31: 2 mg via INTRAVENOUS
  Filled 2015-03-30: qty 1

## 2015-03-30 MED ORDER — MOMETASONE FURO-FORMOTEROL FUM 200-5 MCG/ACT IN AERO
2.0000 | INHALATION_SPRAY | Freq: Two times a day (BID) | RESPIRATORY_TRACT | Status: DC
  Administered 2015-03-30 (×2): 2 via RESPIRATORY_TRACT
  Filled 2015-03-30: qty 8.8

## 2015-03-30 MED ORDER — ZOLPIDEM TARTRATE 5 MG PO TABS
5.0000 mg | ORAL_TABLET | Freq: Once | ORAL | Status: AC
  Administered 2015-03-30: 5 mg via ORAL
  Filled 2015-03-30: qty 1

## 2015-03-30 NOTE — Progress Notes (Signed)
Utilization Review Completed.  

## 2015-03-30 NOTE — Progress Notes (Signed)
[facility] PROGRESS NOTE  Jordan Dickerson ZOX:[id number] DOB: Apr 16, 1991 DOA: 03/29/2015 PCP: No primary care provider on file. Interim summary:  24 year old make with h/o asthma and substance abuse presents with sob and productive cough. His CXR does not reveal any cardiopulmonary disease, and his respiratory panel is negative. His influenza PCR is negative. HIS UDS is positive for tetrahydrocannabinol. He was found to be in respiratory acidosis and required BIPAP on the day of admission. Later on he was weaned off the bipap and is on [place] oxygen.   Assessment/Plan: Acute respiratory failure from hypoxia secondary to acute asthma exacerbation: Initially admitted to stepdown as he is requiring BIPAP.  He was started on IV solumedrol 60 every 6 hours and we have transitioned to twice a day. Off bipap today and if able to tolerate [place] oxygen, can transfer to telemetry later today. Resume bronchodilators as scheduled and add dulera.  He reports being very anxious, add xanax or ativan as needed.  z pack for bronchitis. Influenza PCR is negative.    Substance abuse: Counseling to be given once he is off bipap today.    Tobacco abuse: Nicotine patch on.          Code Status: full code.  Family Communication: none at bedside Disposition Plan: pending possible transfer to telemetry today   Consultants:  none  Procedures:  none  Antibiotics:  zithromax.  HPI/Subjective: No new complaints., reports he was feeling anxious and feel like he couldn't breathe and wanted the respiratory therapist to put him on oxygen.  Objective: Filed Vitals:   03/30/15 0802  BP:   Pulse: 115  Temp:   Resp: 18    Intake/Output Summary (Last 24 hours) at 03/30/15 8119 Last data filed at 03/30/15 0600  Gross per 24 hour  Intake 2087.5 ml  Output   1500 ml  Net  587.5 ml   Filed Weights   03/29/15 1405  Weight: 70.4 kg (155 lb 3.3 oz)    Exam:   General:  Alert , on BIPAP this  am, comfortable, not in distress  Cardiovascular: s1s2, tachycardic, no m/r/g  Respiratory: scattered wheezing anterior, diminished air entry at bases  Abdomen: soft non tender non distended bowel sounds heard  Musculoskeletal: no pedal edema, cyanosis or clubbing.   Data Reviewed: Basic Metabolic Panel:  Recent Labs Lab 03/29/15 0120  NA 140  K 3.7  CL 106  CO2 27  GLUCOSE 94  BUN 13  CREATININE 1.17  CALCIUM 9.1   Liver Function Tests: No results for input(s): AST, ALT, ALKPHOS, BILITOT, PROT, ALBUMIN in the last 168 hours. No results for input(s): LIPASE, AMYLASE in the last 168 hours. No results for input(s): AMMONIA in the last 168 hours. CBC:  Recent Labs Lab 03/29/15 0120  WBC 6.7  NEUTROABS 2.2  HGB 14.7  HCT 43.4  MCV 83.3  PLT 200   Cardiac Enzymes: No results for input(s): CKTOTAL, CKMB, CKMBINDEX, TROPONINI in the last 168 hours. BNP (last 3 results) No results for input(s): BNP in the last 8760 hours.  ProBNP (last 3 results) No results for input(s): PROBNP in the last 8760 hours.  CBG: No results for input(s): GLUCAP in the last 168 hours.  Recent Results (from the past 240 hour(s))  Respiratory virus panel     Status: None   Collection Time: 03/29/15  4:50 AM  Result Value Ref Range Status   Source - RVPAN NASAL SWAB  Corrected   Respiratory Syncytial Virus A  Negative Negative Final   Respiratory Syncytial Virus B Negative Negative Final   Influenza A Negative Negative Final   Influenza B Negative Negative Final   Parainfluenza 1 Negative Negative Final   Parainfluenza 2 Negative Negative Final   Parainfluenza 3 Negative Negative Final   Metapneumovirus Negative Negative Final   Rhinovirus Negative Negative Final   Adenovirus Negative Negative Final    Comment: (NOTE) Performed At: [facility] [address] [place], [place] [id number] Mila HomerHancock William F MD WU:[id number]:[phone]   Culture, blood (routine x 2) Call MD if unable to  obtain prior to antibiotics being given     Status: None (Preliminary result)   Collection Time: 03/29/15  5:00 AM  Result Value Ref Range Status   Specimen Description BLOOD RIGHT ARM  Final   Special Requests BOTTLES DRAWN AEROBIC AND ANAEROBIC 10CC EACH  Final   Culture   Final           BLOOD CULTURE RECEIVED NO GROWTH TO DATE CULTURE WILL BE HELD FOR 5 DAYS BEFORE ISSUING A FINAL NEGATIVE REPORT Note: Culture results may be compromised due to an excessive volume of blood received in culture bottles. Performed at [facility]    Report Status PENDING  Incomplete  Culture, blood (routine x 2) Call MD if unable to obtain prior to antibiotics being given     Status: None (Preliminary result)   Collection Time: 03/29/15  5:06 AM  Result Value Ref Range Status   Specimen Description BLOOD BLOOD RIGHT FOREARM  Final   Special Requests BOTTLES DRAWN AEROBIC AND ANAEROBIC 10CC EACH  Final   Culture   Final           BLOOD CULTURE RECEIVED NO GROWTH TO DATE CULTURE WILL BE HELD FOR 5 DAYS BEFORE ISSUING A FINAL NEGATIVE REPORT Note: Culture results may be compromised due to an excessive volume of blood received in culture bottles. Performed at [facility]    Report Status PENDING  Incomplete  MRSA PCR Screening     Status: None   Collection Time: 03/29/15  2:04 PM  Result Value Ref Range Status   MRSA by PCR NEGATIVE NEGATIVE Final    Comment:        The GeneXpert MRSA Assay (FDA approved for NASAL specimens only), is one component of a comprehensive MRSA colonization surveillance program. It is not intended to diagnose MRSA infection nor to guide or monitor treatment for MRSA infections.      Studies: Dg Chest Port 1 View  03/29/2015   CLINICAL DATA:  Asthma exacerbation.  Shortness of breath.  EXAM: PORTABLE CHEST - 1 VIEW  COMPARISON:  12/11/2014  FINDINGS: Hyperinflation. The heart size and mediastinal contours are within normal limits. Both lungs are clear.  The visualized skeletal structures are unremarkable.  IMPRESSION: No active disease.   Electronically Signed   By: Burman NievesWilliam  Stevens M.D.   On: 03/29/2015 02:31    Scheduled Meds: . antiseptic oral rinse  7 mL Mouth Rinse q12n4p  . azithromycin  250 mg Oral Daily  . chlorhexidine  15 mL Mouth Rinse BID  . dextromethorphan-guaiFENesin  1 tablet Oral BID  . heparin  5,000 Units Subcutaneous 3 times per day  . ipratropium-albuterol  3 mL Nebulization Q4H  . methylPREDNISolone (SOLU-MEDROL) injection  60 mg Intravenous Q6H  . nicotine  14 mg Transdermal Daily   Continuous Infusions: . sodium chloride 20 mL/hr at 03/29/15 2100  . albuterol 10 mg/hr (03/29/15 0200)  Principal Problem:   Asthma exacerbation Active Problems:   Substance abuse   Respiratory distress    Time spent: 25 minutes.     [facility]  [facility] Pager [phone] If 7PM-7AM, please contact night-coverage at [email or web address], password [facility] 03/30/2015, 9:21 AM  LOS: 1 day

## 2015-03-31 DIAGNOSIS — J9601 Acute respiratory failure with hypoxia: Secondary | ICD-10-CM

## 2015-03-31 MED ORDER — ALBUTEROL SULFATE (2.5 MG/3ML) 0.083% IN NEBU: 2.5000 mg | INHALATION_SOLUTION | RESPIRATORY_TRACT | Status: DC | PRN

## 2015-03-31 MED ORDER — IPRATROPIUM-ALBUTEROL 0.5-2.5 (3) MG/3ML IN SOLN
3.0000 mL | Freq: Three times a day (TID) | RESPIRATORY_TRACT | Status: DC
  Administered 2015-03-31 – 2015-04-01 (×2): 3 mL via RESPIRATORY_TRACT
  Filled 2015-03-31 (×2): qty 3

## 2015-03-31 MED ORDER — BUDESONIDE 0.25 MG/2ML IN SUSP
0.2500 mg | Freq: Two times a day (BID) | RESPIRATORY_TRACT | Status: DC
  Administered 2015-03-31 – 2015-04-01 (×3): 0.25 mg via RESPIRATORY_TRACT
  Filled 2015-03-31 (×5): qty 2

## 2015-03-31 MED ORDER — BENZONATATE 100 MG PO CAPS
200.0000 mg | ORAL_CAPSULE | Freq: Three times a day (TID) | ORAL | Status: DC
  Administered 2015-03-31 – 2015-04-01 (×3): 200 mg via ORAL
  Filled 2015-03-31 (×5): qty 2

## 2015-03-31 NOTE — Progress Notes (Signed)
NURSING PROGRESS NOTE  Jordan Dickerson [id number] Transfer Data: 03/31/2015 11:45 AM Attending Provider: Maretta BeesShanker M Ghimire, MD PCP:No primary care provider on file. Code Status: FULL  Jordan Dickerson is a 24 y.o. male patient transferred from 2H -No acute distress noted.  -No complaints of shortness of breath.  -No complaints of chest pain.    Blood pressure 104/60, pulse 114, temperature 97.9 F (36.6 C), temperature source Oral, resp. rate 18, height 5\' 8"  (1.727 m), weight 70.4 kg (155 lb 3.3 oz), SpO2 91 %.   IV Fluids:  IV in place, occlusive dsg intact without redness, IV cath antecubital left, condition patent and no redness none.   Allergies:  Review of patient's allergies indicates no known allergies.  Past Medical History:   has a past medical history of Asthma and Substance abuse.  Past Surgical History:   has no past surgical history on file.  Social History:   reports that he has been smoking Cigarettes.  He has been smoking about 0.25 packs per day. He does not have any smokeless tobacco history on file. He reports that he uses illicit drugs (Marijuana) about 7 times per week. He reports that he does not drink alcohol.  Skin: intact  Patient/Family orientated to room. Information packet given to patient/family. Admission inpatient armband information verified with patient/family to include name and date of birth and placed on patient arm. Side rails up x 2, fall assessment and education completed with patient/family. Patient/family able to verbalize understanding of risk associated with falls and verbalized understanding to call for assistance before getting out of bed. Call light within reach. Patient/family able to voice and demonstrate understanding of unit orientation instructions.    Will continue to evaluate and treat per MD orders.

## 2015-03-31 NOTE — Progress Notes (Signed)
[place] to [phone]

## 2015-03-31 NOTE — Progress Notes (Signed)
PATIENT DETAILS Name: Jordan Dickerson Age: 24 y.o. Sex: male Date of Birth: 11-Sep-1991 Admit Date: 03/29/2015 Admitting Physician Lorretta Harp, MD PCP:No primary care provider on file.  Subjective: Better, off BiPAP. Complains of cough. Still wheezing.  Assessment/Plan: Principal Problem:   Acute hypoxic respiratory failure: Secondary to Asthma exacerbation. Required BiPAP on admission. Weaned off BiPAP. Continue with steroids, bronchodilators and other supportive measures. Wean off oxygen as tolerated  Active Problems:   Acute asthma exacerbation: Improving with IV steroids, nebulized bronchodilators. We will continue to follow. Moving air, has bilateral dorsal rhonchi still.   Substance abuse: urine drug screen positive for marijuana-counseled  Disposition: Remain inpatient  Antibiotics:  See below   Anti-infectives    Start     Dose/Rate Route Frequency Ordered Stop   03/30/15 1000  azithromycin (ZITHROMAX) tablet 250 mg     250 mg Oral Daily 03/29/15 0424 04/03/15 0959   03/29/15 1000  azithromycin (ZITHROMAX) tablet 500 mg     500 mg Oral Daily 03/29/15 0424 03/29/15 1043      DVT Prophylaxis: Prophylactic  Heparin  Code Status: Full code   Family Communication None  Procedures:  None  CONSULTS:  None  Time spent 40 minutes-which includes 50% of the time with face-to-face with patient/ family and coordinating care related to the above assessment and plan.  MEDICATIONS: Scheduled Meds: . antiseptic oral rinse  7 mL Mouth Rinse q12n4p  . azithromycin  250 mg Oral Daily  . benzonatate  200 mg Oral TID  . budesonide (PULMICORT) nebulizer solution  0.25 mg Nebulization BID  . chlorhexidine  15 mL Mouth Rinse BID  . dextromethorphan-guaiFENesin  1 tablet Oral BID  . heparin  5,000 Units Subcutaneous 3 times per day  . ipratropium-albuterol  3 mL Nebulization Q4H  . methylPREDNISolone (SOLU-MEDROL) injection  60 mg Intravenous Q12H  . nicotine  14 mg  Transdermal Daily   Continuous Infusions:  PRN Meds:.albuterol, ALPRAZolam, LORazepam    PHYSICAL EXAM: Vital signs in last 24 hours: Filed Vitals:   03/31/15 0935 03/31/15 1000 03/31/15 1142 03/31/15 1206  BP:   104/60   Pulse: 109 105 114   Temp:   97.9 F (36.6 C)   TempSrc:   Oral   Resp: Height:      Weight:      SpO2: 82% 91% 91% 92%    Weight change:  Filed Weights   03/29/15 1405  Weight: 70.4 kg (155 lb 3.3 oz)   Body mass index is 23.6 kg/(m^2).   Gen Exam: Awake and alert with clear speech. Sleeping comfortably, not in any distress.  Neck: Supple, No JVD.   Chest: Good air entry bilaterally, bilateral rhonchi present. CVS: S1 S2 Regular, no murmurs.  Abdomen: soft, BS +, non tender, non distended.  Extremities: no edema, lower extremities warm to touch. Neurologic: Non Focal.   Skin: No Rash.   Wounds: N/A.    Intake/Output from previous day:  Intake/Output Summary (Last 24 hours) at 03/31/15 1309 Last data filed at 03/31/15 1100  Gross per 24 hour  Intake    150 ml  Output   1050 ml  Net   -900 ml     LAB RESULTS: CBC  Recent Labs Lab 03/29/15 0120  WBC 6.7  HGB 14.7  HCT 43.4  PLT 200  MCV 83.3  MCH 28.2  MCHC 33.9  RDW 12.8  LYMPHSABS 3.6  MONOABS 0.4  EOSABS 0.5  BASOSABS 0.0    Chemistries   Recent Labs Lab 03/29/15 0120 03/30/15 1104  NA 140 138  K 3.7 4.3  CL 106 104  CO2 27 27  GLUCOSE 94 128*  BUN 13 20  CREATININE 1.17 1.06  CALCIUM 9.1 9.8    CBG: No results for input(s): GLUCAP in the last 168 hours.  GFR Estimated Creatinine Clearance: 104 mL/min (by C-G formula based on Cr of 1.06).  Coagulation profile No results for input(s): INR, PROTIME in the last 168 hours.  Cardiac Enzymes No results for input(s): CKMB, TROPONINI, MYOGLOBIN in the last 168 hours.  Invalid input(s): CK  Invalid input(s): POCBNP  Recent Labs  03/30/15 1104  DDIMER <0.27   No results for input(s): HGBA1C  in the last 72 hours. No results for input(s): CHOL, HDL, LDLCALC, TRIG, CHOLHDL, LDLDIRECT in the last 72 hours. No results for input(s): TSH, T4TOTAL, T3FREE, THYROIDAB in the last 72 hours.  Invalid input(s): FREET3 No results for input(s): VITAMINB12, FOLATE, FERRITIN, TIBC, IRON, RETICCTPCT in the last 72 hours. No results for input(s): LIPASE, AMYLASE in the last 72 hours.  Urine Studies No results for input(s): UHGB, CRYS in the last 72 hours.  Invalid input(s): UACOL, UAPR, USPG, UPH, UTP, UGL, UKET, UBIL, UNIT, UROB, ULEU, UEPI, UWBC, URBC, UBAC, CAST, UCOM, BILUA  MICROBIOLOGY: Recent Results (from the past 240 hour(s))  Respiratory virus panel     Status: None   Collection Time: 03/29/15  4:50 AM  Result Value Ref Range Status   Source - RVPAN NASAL SWAB  Corrected   Respiratory Syncytial Virus A Negative Negative Final   Respiratory Syncytial Virus B Negative Negative Final   Influenza A Negative Negative Final   Influenza B Negative Negative Final   Parainfluenza 1 Negative Negative Final   Parainfluenza 2 Negative Negative Final   Parainfluenza 3 Negative Negative Final   Metapneumovirus Negative Negative Final   Rhinovirus Negative Negative Final   Adenovirus Negative Negative Final    Comment: (NOTE) Performed At: [facility] [address] [place], [place] [id number] Mila Homer MD BJ:[id number]   Culture, blood (routine x 2) Call MD if unable to obtain prior to antibiotics being given     Status: None (Preliminary result)   Collection Time: 03/29/15  5:00 AM  Result Value Ref Range Status   Specimen Description BLOOD RIGHT ARM  Final   Special Requests BOTTLES DRAWN AEROBIC AND ANAEROBIC 10CC EACH  Final   Culture   Final           BLOOD CULTURE RECEIVED NO GROWTH TO DATE CULTURE WILL BE HELD FOR 5 DAYS BEFORE ISSUING A FINAL NEGATIVE REPORT Note: Culture results may be compromised due to an excessive volume of blood received in culture  bottles. Performed at [facility]    Report Status PENDING  Incomplete  Culture, blood (routine x 2) Call MD if unable to obtain prior to antibiotics being given     Status: None (Preliminary result)   Collection Time: 03/29/15  5:06 AM  Result Value Ref Range Status   Specimen Description BLOOD BLOOD RIGHT FOREARM  Final   Special Requests BOTTLES DRAWN AEROBIC AND ANAEROBIC 10CC EACH  Final   Culture   Final           BLOOD CULTURE RECEIVED NO GROWTH TO DATE CULTURE WILL BE HELD FOR 5 DAYS BEFORE ISSUING A FINAL NEGATIVE REPORT Note: Culture results may be compromised due to  an excessive volume of blood received in culture bottles. Performed at [facility]    Report Status PENDING  Incomplete  MRSA PCR Screening     Status: None   Collection Time: 03/29/15  2:04 PM  Result Value Ref Range Status   MRSA by PCR NEGATIVE NEGATIVE Final    Comment:        The GeneXpert MRSA Assay (FDA approved for NASAL specimens only), is one component of a comprehensive MRSA colonization surveillance program. It is not intended to diagnose MRSA infection nor to guide or monitor treatment for MRSA infections.     RADIOLOGY STUDIES/RESULTS: Dg Chest Port 1 View  03/29/2015   CLINICAL DATA:  Asthma exacerbation.  Shortness of breath.  EXAM: PORTABLE CHEST - 1 VIEW  COMPARISON:  12/11/2014  FINDINGS: Hyperinflation. The heart size and mediastinal contours are within normal limits. Both lungs are clear. The visualized skeletal structures are unremarkable.  IMPRESSION: No active disease.   Electronically Signed   By: Burman NievesWilliam  Stevens M.D.   On: 03/29/2015 02:31    Jeoffrey MassedGHIMIRE,SHANKER, MD  [facility] Pager:[phone]  If 7PM-7AM, please contact night-coverage [email or web address] Password [email or web address] 03/31/2015, 1:09 PM   LOS: 2 days

## 2015-04-01 MED ORDER — BUDESONIDE-FORMOTEROL FUMARATE 160-4.5 MCG/ACT IN AERO
2.0000 | INHALATION_SPRAY | Freq: Two times a day (BID) | RESPIRATORY_TRACT | Status: DC
  Administered 2015-04-01: 2 via RESPIRATORY_TRACT
  Filled 2015-04-01: qty 6

## 2015-04-01 MED ORDER — IPRATROPIUM-ALBUTEROL 0.5-2.5 (3) MG/3ML IN SOLN: 3.0000 mL | RESPIRATORY_TRACT | Status: DC | PRN

## 2015-04-01 MED ORDER — ALBUTEROL SULFATE (2.5 MG/3ML) 0.083% IN NEBU: 2.5000 mg | INHALATION_SOLUTION | RESPIRATORY_TRACT | Status: DC | PRN

## 2015-04-01 MED ORDER — PREDNISONE 20 MG PO TABS
40.0000 mg | ORAL_TABLET | Freq: Every day | ORAL | Status: DC
  Filled 2015-04-01: qty 2

## 2015-04-01 MED ORDER — DM-GUAIFENESIN ER 30-600 MG PO TB12: 1.0000 | ORAL_TABLET | Freq: Two times a day (BID) | ORAL | Status: AC

## 2015-04-01 MED ORDER — BUDESONIDE-FORMOTEROL FUMARATE 160-4.5 MCG/ACT IN AERO: 2.0000 | INHALATION_SPRAY | Freq: Two times a day (BID) | RESPIRATORY_TRACT | Status: AC

## 2015-04-01 MED ORDER — PREDNISONE 10 MG PO TABS: ORAL_TABLET | ORAL | Status: AC

## 2015-04-01 MED ORDER — LORATADINE 10 MG PO TABS: 10.0000 mg | ORAL_TABLET | Freq: Every day | ORAL | Status: AC

## 2015-04-01 MED ORDER — AZITHROMYCIN 250 MG PO TABS: 250.0000 mg | ORAL_TABLET | Freq: Every day | ORAL | Status: AC

## 2015-04-01 MED ORDER — ALBUTEROL SULFATE HFA 108 (90 BASE) MCG/ACT IN AERS: 2.0000 | INHALATION_SPRAY | RESPIRATORY_TRACT | Status: AC | PRN

## 2015-04-01 NOTE — Discharge Summary (Addendum)
PATIENT DETAILS Name: Jordan Dickerson Age: 24 y.o. Sex: male Date of Birth: 05-19-1991 MRN: [id number]. Admitting Physician: Lorretta Harp, MD PCP:No primary care provider on file.  Admit Date: 03/29/2015 Discharge date: 04/01/2015  Recommendations for Outpatient Follow-up:  1. Optimize asthma medications-started on Symbicort.  PRIMARY DISCHARGE DIAGNOSIS:  Principal Problem:   Asthma exacerbation Active Problems:   Substance abuse   Respiratory distress      PAST MEDICAL HISTORY: Past Medical History  Diagnosis Date  . Asthma   . Substance abuse     DISCHARGE MEDICATIONS: Current Discharge Medication List    START taking these medications   Details  albuterol (PROVENTIL HFA;VENTOLIN HFA) 108 (90 BASE) MCG/ACT inhaler Inhale 2 puffs into the lungs every 4 (four) hours as needed for wheezing or shortness of breath. Qty: 1 Inhaler, Refills: 0    azithromycin (ZITHROMAX) 250 MG tablet Take 1 tablet (250 mg total) by mouth daily. 2 more days from 4/10 Qty: 2 tablet, Refills: 0    budesonide-formoterol (SYMBICORT) 160-4.5 MCG/ACT inhaler Inhale 2 puffs into the lungs 2 (two) times daily. Qty: 1 Inhaler, Refills: 0    dextromethorphan-guaiFENesin (MUCINEX DM) 30-600 MG per 12 hr tablet Take 1 tablet by mouth 2 (two) times daily. Qty: 15 tablet, Refills: 0    loratadine (CLARITIN) 10 MG tablet Take 1 tablet (10 mg total) by mouth daily. Qty: 7 tablet, Refills: 0      CONTINUE these medications which have CHANGED   Details  predniSONE (DELTASONE) 10 MG tablet Take 4 tablets (40 mg) daily for 2 days, then, Take 3 tablets (30 mg) daily for 2 days, then, Take 2 tablets (20 mg) daily for 2 days, then, Take 1 tablets (10 mg) daily for 1 days, then stop Qty: 19 tablet, Refills: 0        ALLERGIES:  No Known Allergies  BRIEF HPI:  See H&P, Labs, Consult and Test reports for all details in brief, patient is a 24 year old male with a history of asthma, marijuana use who  presented with worsening shortness of breath and cough. Patient was found to have asthma exacerbation and admitted to the hospital for further evaluation and treatment  CONSULTATIONS:   None  PERTINENT RADIOLOGIC STUDIES: Dg Chest Port 1 View  03/29/2015   CLINICAL DATA:  Asthma exacerbation.  Shortness of breath.  EXAM: PORTABLE CHEST - 1 VIEW  COMPARISON:  12/11/2014  FINDINGS: Hyperinflation. The heart size and mediastinal contours are within normal limits. Both lungs are clear. The visualized skeletal structures are unremarkable.  IMPRESSION: No active disease.   Electronically Signed   By: Burman Nieves M.D.   On: 03/29/2015 02:31     PERTINENT LAB RESULTS: CBC: No results for input(s): WBC, HGB, HCT, PLT in the last 72 hours. CMET CMP     Component Value Date/Time   NA 138 03/30/2015 1104   K 4.3 03/30/2015 1104   CL 104 03/30/2015 1104   CO2 27 03/30/2015 1104   GLUCOSE 128* 03/30/2015 1104   BUN 20 03/30/2015 1104   CREATININE 1.06 03/30/2015 1104   CALCIUM 9.8 03/30/2015 1104   GFRNONAA >90 03/30/2015 1104   GFRAA >90 03/30/2015 1104    GFR Estimated Creatinine Clearance: 104 mL/min (by C-G formula based on Cr of 1.06). No results for input(s): LIPASE, AMYLASE in the last 72 hours. No results for input(s): CKTOTAL, CKMB, CKMBINDEX, TROPONINI in the last 72 hours. Invalid input(s): POCBNP  Recent Labs  03/30/15 1104  DDIMER <0.27  No results for input(s): HGBA1C in the last 72 hours. No results for input(s): CHOL, HDL, LDLCALC, TRIG, CHOLHDL, LDLDIRECT in the last 72 hours. No results for input(s): TSH, T4TOTAL, T3FREE, THYROIDAB in the last 72 hours.  Invalid input(s): FREET3 No results for input(s): VITAMINB12, FOLATE, FERRITIN, TIBC, IRON, RETICCTPCT in the last 72 hours. Coags: No results for input(s): INR in the last 72 hours.  Invalid input(s): PT Microbiology: Recent Results (from the past 240 hour(s))  Respiratory virus panel     Status: None    Collection Time: 03/29/15  4:50 AM  Result Value Ref Range Status   Source - RVPAN NASAL SWAB  Corrected   Respiratory Syncytial Virus A Negative Negative Final   Respiratory Syncytial Virus B Negative Negative Final   Influenza A Negative Negative Final   Influenza B Negative Negative Final   Parainfluenza 1 Negative Negative Final   Parainfluenza 2 Negative Negative Final   Parainfluenza 3 Negative Negative Final   Metapneumovirus Negative Negative Final   Rhinovirus Negative Negative Final   Adenovirus Negative Negative Final    Comment: (NOTE) Performed At: [facility] [address] [place], [place] [id number] Mila HomerHancock William F MD NW:[id number]:[phone]   Culture, blood (routine x 2) Call MD if unable to obtain prior to antibiotics being given     Status: None (Preliminary result)   Collection Time: 03/29/15  5:00 AM  Result Value Ref Range Status   Specimen Description BLOOD RIGHT ARM  Final   Special Requests BOTTLES DRAWN AEROBIC AND ANAEROBIC 10CC EACH  Final   Culture   Final           BLOOD CULTURE RECEIVED NO GROWTH TO DATE CULTURE WILL BE HELD FOR 5 DAYS BEFORE ISSUING A FINAL NEGATIVE REPORT Note: Culture results may be compromised due to an excessive volume of blood received in culture bottles. Performed at [facility]    Report Status PENDING  Incomplete  Culture, blood (routine x 2) Call MD if unable to obtain prior to antibiotics being given     Status: None (Preliminary result)   Collection Time: 03/29/15  5:06 AM  Result Value Ref Range Status   Specimen Description BLOOD BLOOD RIGHT FOREARM  Final   Special Requests BOTTLES DRAWN AEROBIC AND ANAEROBIC 10CC EACH  Final   Culture   Final           BLOOD CULTURE RECEIVED NO GROWTH TO DATE CULTURE WILL BE HELD FOR 5 DAYS BEFORE ISSUING A FINAL NEGATIVE REPORT Note: Culture results may be compromised due to an excessive volume of blood received in culture bottles. Performed at [facility]     Report Status PENDING  Incomplete  MRSA PCR Screening     Status: None   Collection Time: 03/29/15  2:04 PM  Result Value Ref Range Status   MRSA by PCR NEGATIVE NEGATIVE Final    Comment:        The GeneXpert MRSA Assay (FDA approved for NASAL specimens only), is one component of a comprehensive MRSA colonization surveillance program. It is not intended to diagnose MRSA infection nor to guide or monitor treatment for MRSA infections.      BRIEF HOSPITAL COURSE:  Acute hypoxic respiratory failure: Secondary to Asthma exacerbation. Required BiPAP on admission. Weaned off BiPAP and transitioned to nasal cannula. Significantly improved with  steroids, bronchodilators and other supportive measures. By day of discharge, weaned off oxygen. Ambulated in the hallway by this M.D., no further bronchospasm- lungs  essentially clear to auscultation with only few scattered rhonchi. Patient feels that his back was usual baseline, he is being discharged home in a stable manner. We have asked him to call [facility] on Monday the office opens to make an appointment for a follow-up visit.  Acute asthma exacerbation: See above. Much improved. On discharge patient will be placed on as needed albuterol inhaler, Symbicort and tapering prednisone. Lungs essentially clear with only a few scattered rhonchi. Weaned off oxygen and ambulating in the hallway without any major issues. Patient feels that he is back to his usual baseline, and his stable for discharge today.  Marijuana abuse: Counseled patient extensively.   TODAY-DAY OF DISCHARGE:  Subjective:   Darshawn Boateng today has no headache,no chest abdominal pain,no new weakness tingling or numbness, feels much better wants to go home today.   Objective:   Blood pressure 117/66, pulse 95, temperature 97.9 F (36.6 C), temperature source Oral, resp. rate 18, height  (1.727 m), weight 70.4 kg (155 lb 3.3 oz), SpO2 95  %.  Intake/Output Summary (Last 24 hours) at 04/01/15 1037 Last data filed at 03/31/15 2208  Gross per 24 hour  Intake    760 ml  Output   1300 ml  Net   -540 ml   Filed Weights   03/29/15 1405  Weight: 70.4 kg (155 lb 3.3 oz)    Exam Awake Alert, Oriented *3, No new F.N deficits, Normal affect [place].AT,PERRAL Supple Neck,No JVD, No cervical lymphadenopathy appriciated.  Symmetrical Chest wall movement, Good air movement bilaterally, CTAB RRR,No Gallops,Rubs or new Murmurs, No Parasternal Heave +ve B.Sounds, Abd Soft, Non tender, No organomegaly appriciated, No rebound -guarding or rigidity. No Cyanosis, Clubbing or edema, No new Rash or bruise  DISCHARGE CONDITION: Stable  DISPOSITION: Home  DISCHARGE INSTRUCTIONS:    Activity:  As tolerated   Diet recommendation: Regular Diet   Discharge Instructions    Call MD for:  difficulty breathing, headache or visual disturbances    Complete by:  As directed      Diet general    Complete by:  As directed      Increase activity slowly    Complete by:  As directed            Follow-up Information    Follow up with [facility]    . Schedule an appointment as soon as possible for a visit in 5 days.   Why:  please call and make appopintment. Please let the office know that you were hospitalized and need a post hospital discharge visit   Contact information:   [address] [place] [address] [phone]      Total Time spent on discharge equals 25 minutes.  SignedJeoffrey Massed 04/01/2015 10:37 AM

## 2015-04-01 NOTE — Progress Notes (Signed)
Jordan CahillMarcus Dickerson to be D/C'd Home per MD order.  Discussed with the patient and all questions fully answered.  VSS, Skin clean, dry and intact without evidence of skin break down, no evidence of skin tears noted. IV catheter discontinued intact. Site without signs and symptoms of complications. Dressing and pressure applied.  An After Visit Summary was printed and given to the patient. Patient received prescription.  D/c education completed with patient/family including follow up instructions, medication list, d/c activities limitations if indicated, with other d/c instructions as indicated by MD - patient able to verbalize understanding, all questions fully answered.   Patient instructed to return to ED, call 911, or call MD for any changes in condition.   Patient escorted via WC, and D/C home via private auto.  L'ESPERANCE, [name] 04/01/2015 12:18 PM

## 2015-04-04 LAB — CULTURE, BLOOD (ROUTINE X 2)
Culture: NO GROWTH
Culture: NO GROWTH

## 2016-02-07 IMAGING — CR DG CHEST 1V PORT
1 series · 1 of 1 positions shown · non-contrast
Comparison: None.

CLINICAL DATA: Shortness of breath, asthma attack

EXAM:
PORTABLE CHEST - 1 VIEW

[AP]
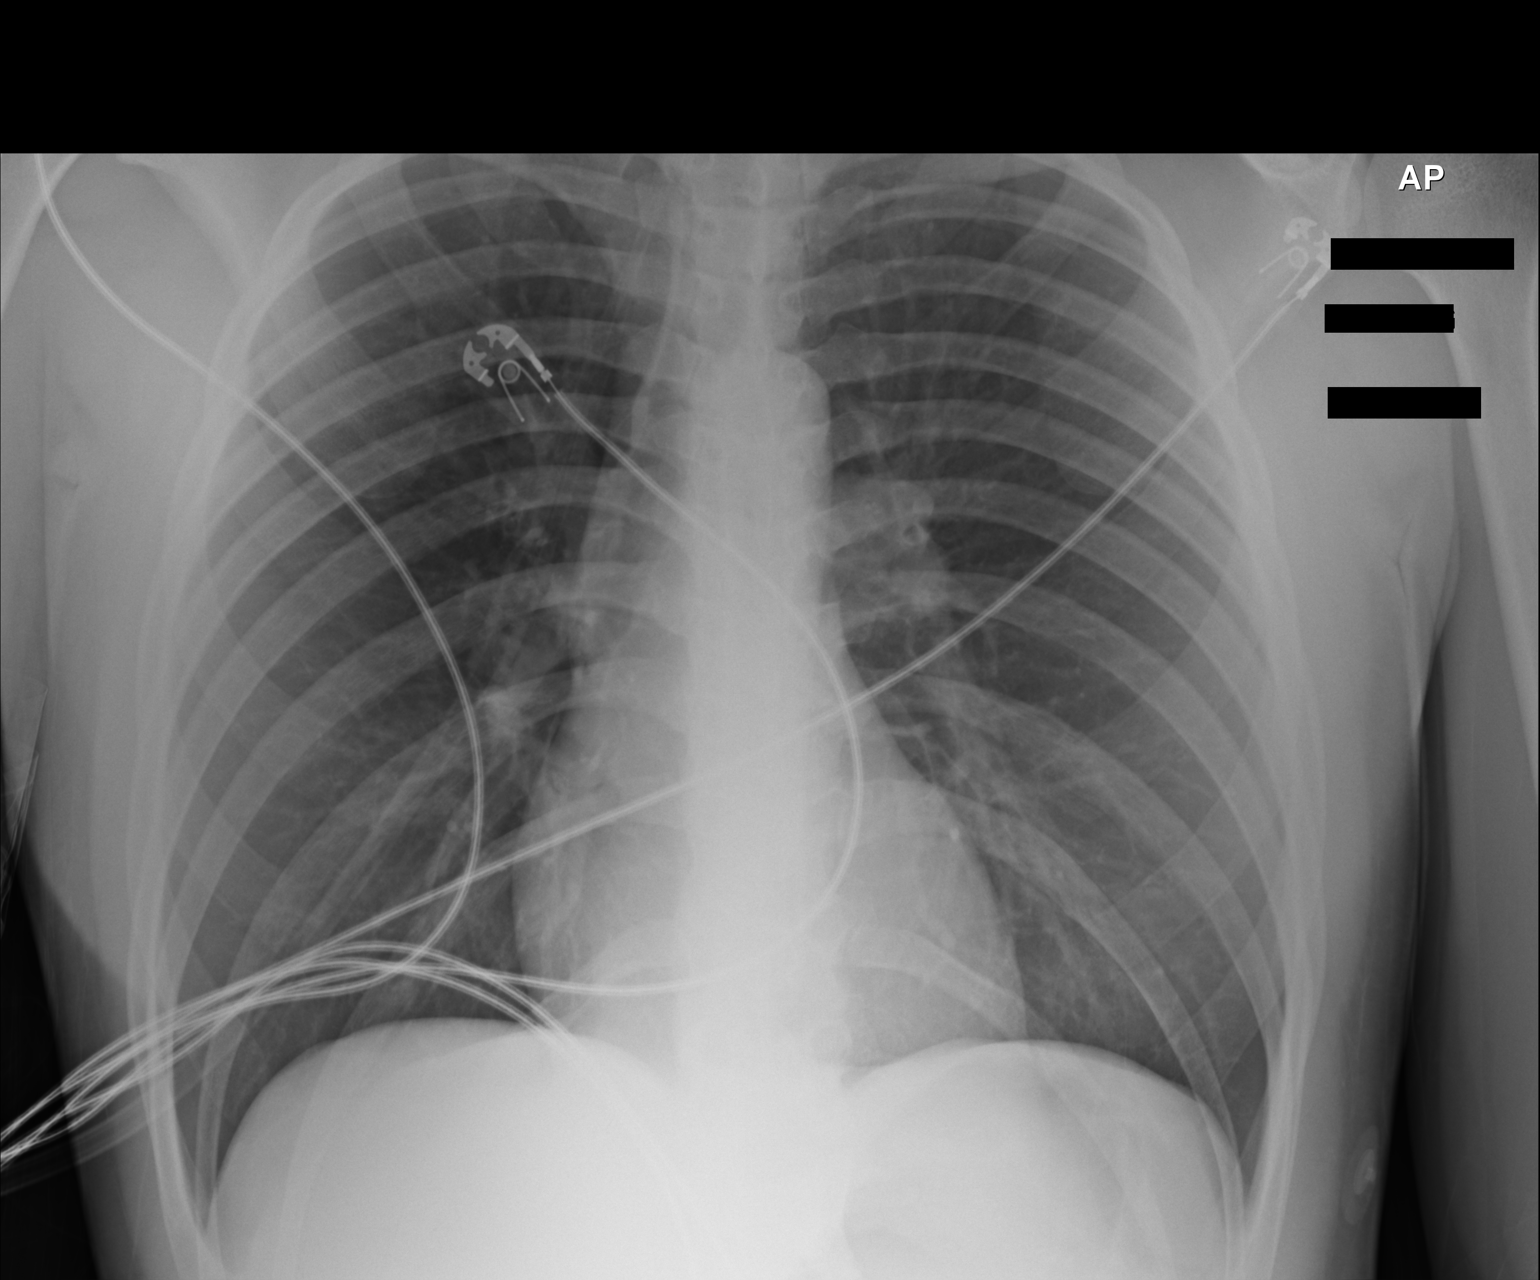

[1 of 1 positions shown; findings below may reference images not displayed]

FINDINGS: Cardiomediastinal silhouette is unremarkable. No acute infiltrate or
pleural effusion. No pulmonary edema. Bony thorax is unremarkable.
Mild hyperinflation.
IMPRESSION: No active disease.  Mild hyperinflation.

## 2016-05-25 IMAGING — CR DG CHEST 1V PORT
2 series · 2 of 2 positions shown · non-contrast
Comparison: 12/11/2014

CLINICAL DATA: Asthma exacerbation.  Shortness of breath.

EXAM:
PORTABLE CHEST - 1 VIEW

[AP (1 of 2)]
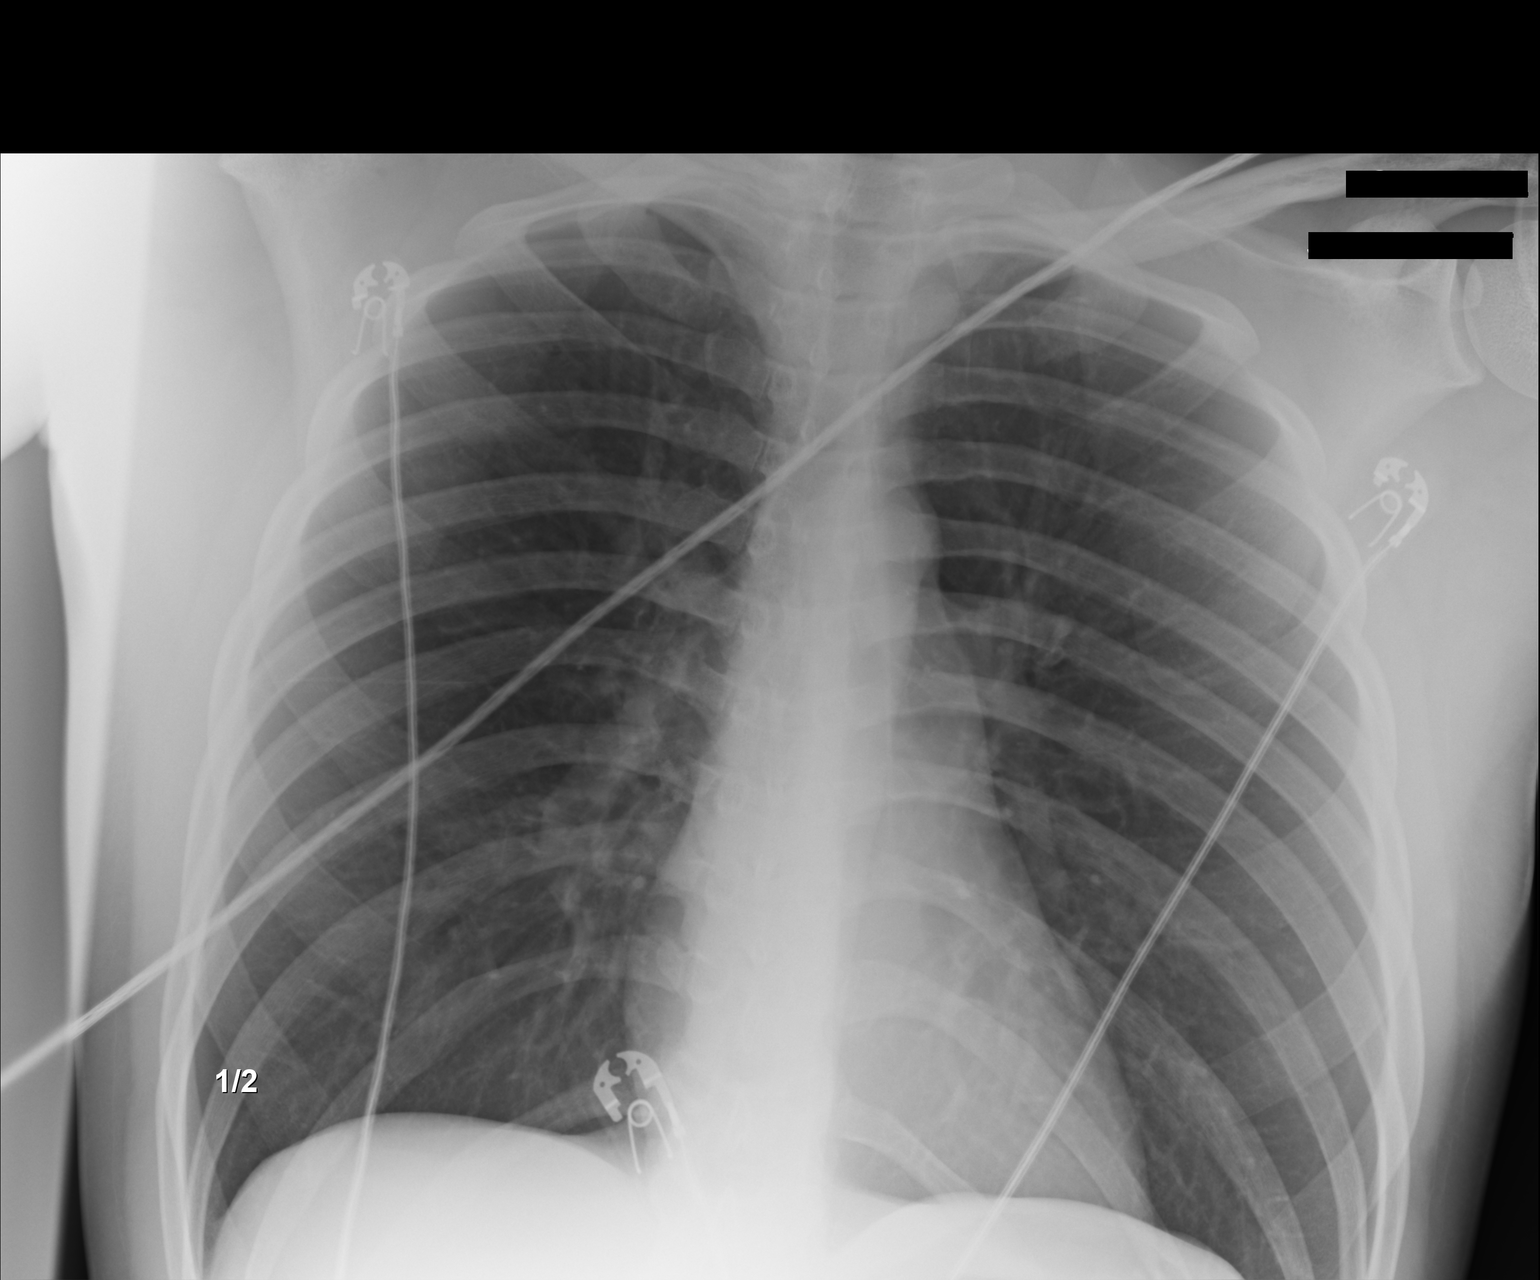

[AP (2 of 2)]
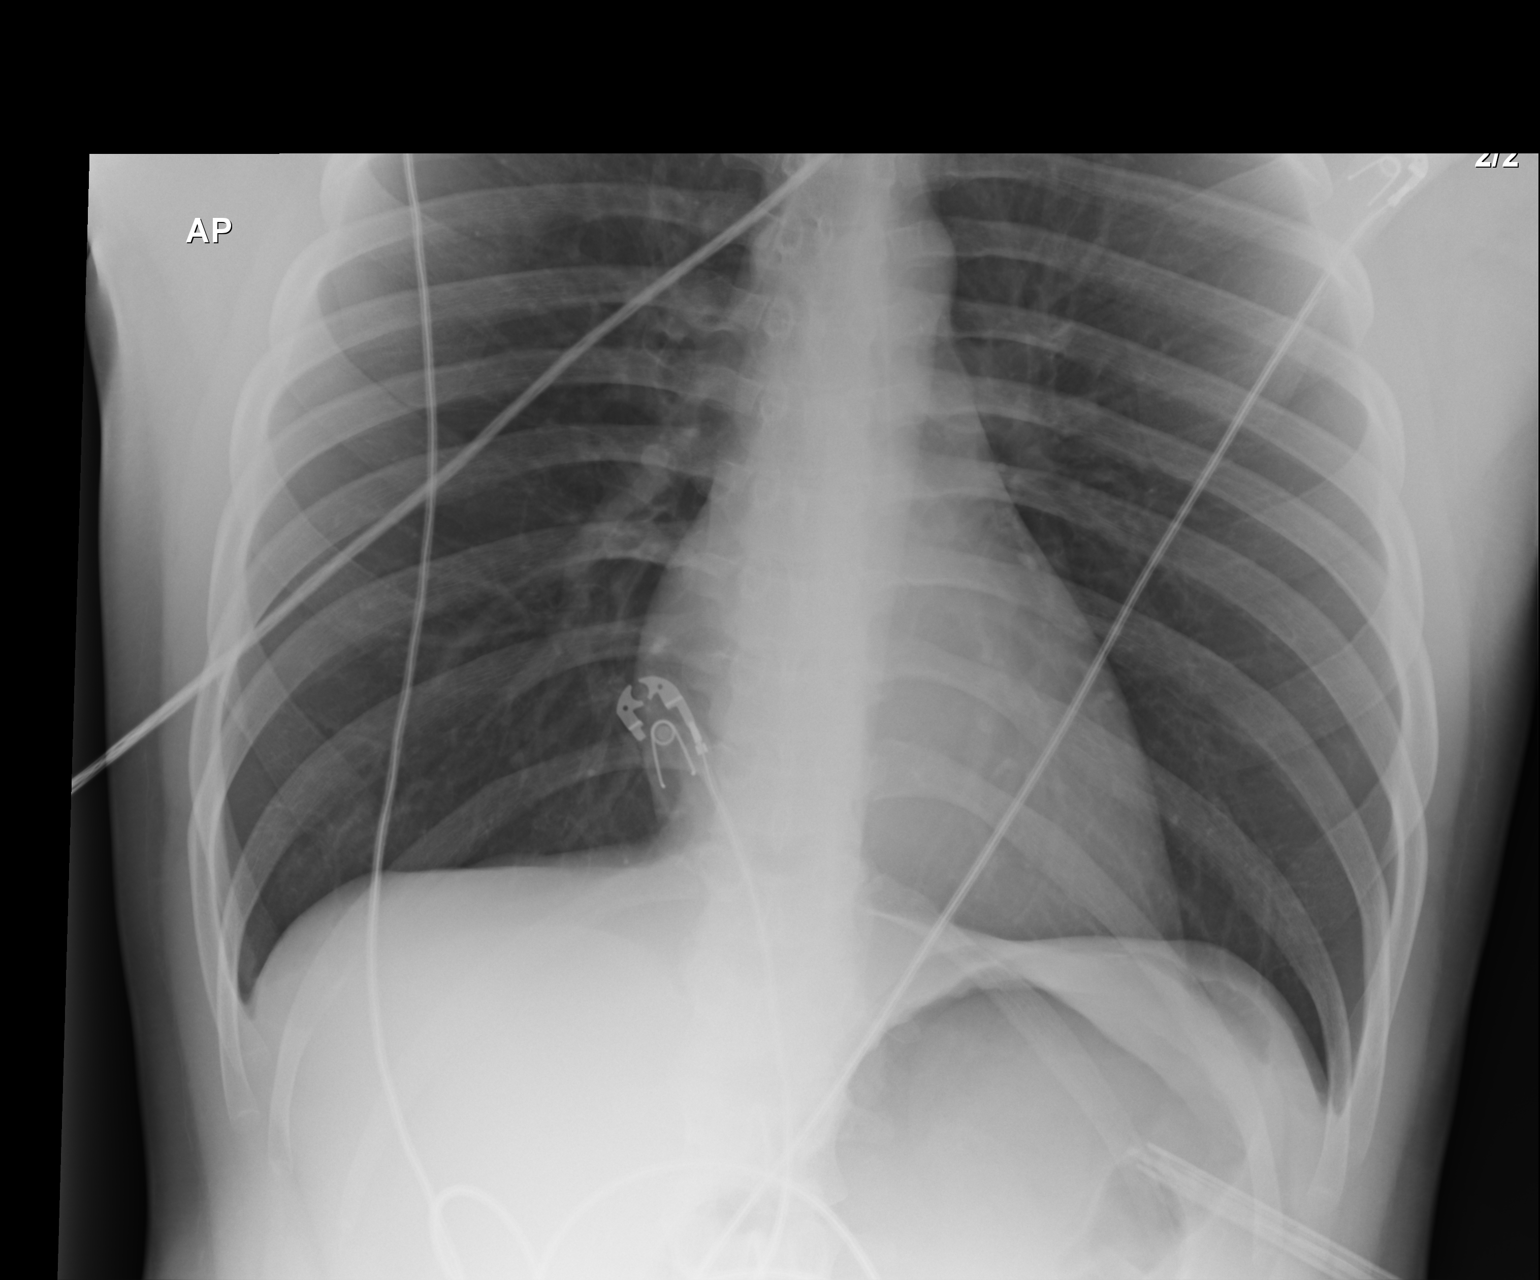

[2 of 2 positions shown; findings below may reference images not displayed]

FINDINGS: Hyperinflation. The heart size and mediastinal contours are within
normal limits. Both lungs are clear. The visualized skeletal
structures are unremarkable.
IMPRESSION: No active disease.
